# Patient Record
Sex: Male | Born: 1975 | Race: White | Hispanic: No | Smoking: Current every day smoker
Health system: Southern US, Community
[De-identification: ages and names within clinical notes are randomized; demographics above are authoritative.]

## PROBLEM LIST (undated history)

## (undated) DIAGNOSIS — J45909 Unspecified asthma, uncomplicated: Secondary | ICD-10-CM

## (undated) HISTORY — PX: ABDOMINAL SURGERY: SHX537

## (undated) HISTORY — PX: OTHER SURGICAL HISTORY: SHX169

## (undated) HISTORY — PX: HERNIA REPAIR: SHX51

## (undated) HISTORY — PX: MANDIBLE FRACTURE SURGERY: SHX706

---

## 2017-08-08 ENCOUNTER — Emergency Department
Admission: EM | Admit: 2017-08-08 | Discharge: 2017-08-08 | Disposition: A | Discharge status: Home / Self Care | Payer: Self-pay | Attending: Emergency Medicine | Admitting: Emergency Medicine

## 2017-08-08 ENCOUNTER — Other Ambulatory Visit: Payer: Self-pay

## 2017-08-08 ENCOUNTER — Encounter: Payer: Self-pay | Admitting: Emergency Medicine

## 2017-08-08 DIAGNOSIS — K047 Periapical abscess without sinus: Secondary | ICD-10-CM

## 2017-08-08 DIAGNOSIS — K130 Diseases of lips: Secondary | ICD-10-CM | POA: Insufficient documentation

## 2017-08-08 DIAGNOSIS — F17210 Nicotine dependence, cigarettes, uncomplicated: Secondary | ICD-10-CM | POA: Insufficient documentation

## 2017-08-08 MED ORDER — PERIGUARD EX OINT: 1.0000 "application " | TOPICAL_OINTMENT | Freq: Two times a day (BID) | CUTANEOUS | 0 refills | Status: AC

## 2017-08-08 MED ORDER — AMOXICILLIN 500 MG PO TABS: 500.0000 mg | ORAL_TABLET | Freq: Three times a day (TID) | ORAL | 0 refills | Status: DC

## 2017-08-08 NOTE — Discharge Instructions (Signed)
Please call and schedule a dental appointment as soon as possible. You will need to be seen within the next 14 days. Return to the emergency department for symptoms that change or worsen if you're unable to schedule an appointment.  OPTIONS FOR DENTAL FOLLOW UP CARE  [place] Department of Health and Human Services - Local Safety Net Dental Clinics http://www.ncdhhs.gov/dph/oralhealth/services/safetynetclinics.htm   [facility] ([phone])  [facility] ([phone])  [facility] ([phone])  [facility] ([phone])  [facility] ([phone]) This clinic caters to the indigent population and is on a lottery system. Location: [facility], [address], [place] Clinic Hours: Wednesdays from 6pm - 9pm, patients seen by a lottery system. For dates, call or go to www.med.[facility].edu/[facility]/patients/Dental-[facility] Services: Cleanings, fillings and simple extractions. Payment Options: DENTAL WORK IS FREE OF CHARGE. Bring proof of income or support. Best way to get seen: Arrive at 5:15 pm - this is a lottery, NOT first come/first serve, so arriving earlier will not increase your chances of being seen.     [facility] [phone] Select option 1 for emergencies   Location: [facility], [address], [place] Clinic Hours: No walk-ins accepted - call the day before to schedule an appointment. Check in times are 9:30 am and 1:30 pm. Services: Simple extractions, temporary fillings, pulpectomy/pulp debridement, uncomplicated abscess drainage. Payment Options: PAYMENT IS DUE AT THE TIME OF SERVICE.  Fee is usually $100-200, additional surgical procedures (e.g. abscess drainage) may be extra. Cash, checks, Visa/MasterCard accepted.  Can file Medicaid if patient is covered for dental - patient should call case worker to check. No  discount for [facility] patients. Best way to get seen: MUST call the day before and get onto the schedule. Can usually be seen the next 1-2 days. No walk-ins accepted.     [facility] [phone]   Location: [facility], [address], [place] [facility] Hours: M, W, Th, F 8am or 1:30pm, Tues 9a or 1:30 - first come/first served. Services: Simple extractions, temporary fillings, uncomplicated abscess drainage.  You do not need to be an [place] resident. Payment Options: PAYMENT IS DUE AT THE TIME OF SERVICE. Dental insurance, otherwise sliding scale - bring proof of income or support. Depending on income and treatment needed, cost is usually $50-200. Best way to get seen: Arrive early as it is first come/first served.     [facility] [phone]   Location: [address] Clinic Hours: Mon-Thu 8a-5p Services: Most basic dental services including extractions and fillings. Payment Options: PAYMENT IS DUE AT THE TIME OF SERVICE. Sliding scale, up to 50% off - bring proof if income or support. Medicaid with dental option accepted. Best way to get seen: Call to schedule an appointment, can usually be seen within 2 weeks OR they will try to see walk-ins - show up at 8a or 2p (you may have to wait).     [facility] [phone] [place] RESIDENTS ONLY   Location: [facility], [address], [place], [place] [phone] Clinic Hours: By appointment only. Monday - Thursday 8am-5pm, Friday 8am-12pm Services: Cleanings, fillings, extractions. Payment Options: PAYMENT IS DUE AT THE TIME OF SERVICE. Cash, Visa or MasterCard. Sliding scale - $30 minimum per service. Best way to get seen: Come in to office, complete packet and make an appointment -   need proof of income or support monies for each household member and proof of [place]  residence. Usually takes about a month to get in.     [facility] [phone]   Location: [address] [place] Clinic Hours: Walk-in Urgent Care Dental Services are offered Monday-Friday mornings only. The numbers of emergencies accepted daily is limited to the number of providers available. Maximum 15 - Mondays, Wednesdays & Thursdays Maximum 10 - Tuesdays & Fridays Services: You do not need to be a [place] resident to be seen for a dental emergency. Emergencies are defined as pain, swelling, abnormal bleeding, or dental trauma. Walkins will receive x-rays if needed. NOTE: Dental cleaning is not an emergency. Payment Options: PAYMENT IS DUE AT THE TIME OF SERVICE. Minimum co-pay is $40.00 for uninsured patients. Minimum co-pay is $3.00 for Medicaid with dental coverage. Dental Insurance is accepted and must be presented at time of visit. Medicare does not cover dental. Forms of payment: Cash, credit card, checks. Best way to get seen: If not previously registered with the clinic, walk-in dental registration begins at 7:15 am and is on a first come/first serve basis. If previously registered with the clinic, call to make an appointment.     [facility] [phone] [place] RESIDENTS ONLY   Location: [address], [place], [place] Clinic Hours: Mon-Thu 10a-2p Services: Extractions only! Payment Options: FREE (donations accepted) - bring proof of income or support Best way to get seen: Call and schedule an appointment OR come at 8am on the 1st Monday of every month (except for holidays) when it is first come/first served.     [facility] [phone]   Location: [address], [place] Clinic Hours: Friday mornings Services, Payment Options, Best way to get seen: Call for info  

## 2017-08-08 NOTE — ED Triage Notes (Signed)
Patient ambulatory to triage with steady gait, without difficulty or distress noted;   upper dental pain with swelling to right jaw; small sore to corner of right mouth

## 2017-08-08 NOTE — ED Notes (Signed)
Pt reports right sided facial swelling x4days. Pt c/o tongue swelling as well as mouth pain from dental complications. Pt states he has had multiple surgeries and plates place in his face.

## 2017-08-08 NOTE — ED Provider Notes (Signed)
[facility] Emergency Department Provider Note ____________________________________________  Time seen: Approximately 8:47 PM  I have reviewed the triage vital signs and the nursing notes.   HISTORY  Chief Complaint No chief complaint on file.   HPI Malachi Kinzler is a 42 y.o. male who presents to the emergency department for treatment and evaluation of  upper right side dental pain and swelling with a chronic ulceration to the corner of the right side of his mouth.  No known fever.   History reviewed. No pertinent past medical history.  There are no active problems to display for this patient.   Past Surgical History:  Procedure Laterality Date  . ABDOMINAL SURGERY    . HERNIA REPAIR    . MANDIBLE FRACTURE SURGERY      Prior to Admission medications   Medication Sig Start Date End Date Taking? Authorizing Provider  amoxicillin (AMOXIL) 500 MG tablet Take 1 tablet (500 mg total) by mouth 3 (three) times daily. 08/08/17   Mildreth Reek, Kasandra Knudsen, FNP  Skin Protectants, Misc. (PERIGUARD) OINT Apply 1 application topically 2 (two) times daily. 08/08/17   Chinita Pester, FNP    Allergies Patient has no known allergies.  No family history on file.  Social History Social History   Tobacco Use  . Smoking status: Current Every Day Smoker  . Smokeless tobacco: Never Used  Substance Use Topics  . Alcohol use: Not Currently  . Drug use: Not on file    Review of Systems Constitutional: Negative for fever. ENT: Positive for dental pain. Musculoskeletal: Positive for jaw pain. Skin: Positive for lesion in the corner of the mouth on the right side. ____________________________________________   PHYSICAL EXAM:  VITAL SIGNS: ED Triage Vitals  Enc Vitals Group     BP 08/08/17 1955 (!) 143/77     Pulse Rate 08/08/17 1955 (!) 103     Resp 08/08/17 1955 20     Temp 08/08/17 1955 98.4 F (36.9 C)     Temp Source 08/08/17 1955 Oral     SpO2 08/08/17 1955 97 %       Weight 08/08/17 1954 240 lb (108.9 kg)     Height 08/08/17 1954  (1.753 m)     Head Circumference --      Peak Flow --      Pain Score 08/08/17 1954 10     Pain Loc --      Pain Edu? --      Excl. in GC? --     Constitutional: Alert and oriented. Well appearing and in no acute distress. Eyes: Conjunctiva are clear without discharge or drainage. Mouth/Throat: Chronic appearing cheilitis to the corner of the mouth on the right side.  Periodontal Exam    Hematological/Lymphatic/Immunilogical: Tender anterior cervical adenopathy bilateral Respiratory: Respirations even and unlabored. Musculoskeletal: No trismus of the jaw Neurologic: Awake, alert, oriented x4 Skin: Mild erythema over the right side upper and lower jaw without erythema Psychiatric: Affect and behavior are appropriate.  ____________________________________________   LABS (all labs ordered are listed, but only abnormal results are displayed)  Labs Reviewed - No data to display ____________________________________________   RADIOLOGY  Not indicated ____________________________________________   PROCEDURES  Procedure(s) performed:   Procedures  Critical Care performed: No ____________________________________________   INITIAL IMPRESSION / ASSESSMENT AND PLAN / ED COURSE  [place] Urieta is a 42 y.o. male who presents to the emergency department for treatment and evaluation of dental pain and a lesion to the corner of  his lip on the right side.  Patient states that he had to have a plate inserted when he fractured his mandible and as a result has had a loss of some sensation on that right side.  He states that he drools significantly in his sleep and has had problems with a chronic crack in the skin in the corner of the mouth.  For the dental abscess he will be treated with amoxicillin and encouraged to continue the ibuprofen.  He was given ointment to apply as a barrier to the corner of his mouth in the  morning and at night.  He was strongly encouraged to schedule appointment with the dentist within 2 weeks.  He will be given a list of dental clinics in the area.  He is advised to return to the emergency department for symptoms that change or worsen if he is unable to see the dentist.  Pertinent labs & imaging results that were available during my care of the patient were reviewed by me and considered in my medical decision making (see chart for details).  ____________________________________________   FINAL CLINICAL IMPRESSION(S) / ED DIAGNOSES  Final diagnoses:  Dental abscess  Angular cheilitis    New Prescriptions   AMOXICILLIN (AMOXIL) 500 MG TABLET    Take 1 tablet (500 mg total) by mouth 3 (three) times daily.   SKIN PROTECTANTS, MISC. (PERIGUARD) OINT    Apply 1 application topically 2 (two) times daily.    If controlled substance prescribed during this visit, 12 month history viewed on the NCCSRS prior to issuing an initial prescription for Schedule II or III opiod.  Note:  This document was prepared using Dragon voice recognition software and may include unintentional dictation errors.    Chinita Pester, FNP 08/08/17 2126    Dionne Bucy, MD 08/08/17 [phone]

## 2017-10-22 ENCOUNTER — Emergency Department: Payer: Self-pay

## 2017-10-22 ENCOUNTER — Other Ambulatory Visit: Payer: Self-pay

## 2017-10-22 ENCOUNTER — Emergency Department
Admission: EM | Admit: 2017-10-22 | Discharge: 2017-10-22 | Disposition: A | Discharge status: Home / Self Care | Payer: Self-pay | Attending: Emergency Medicine | Admitting: Emergency Medicine

## 2017-10-22 DIAGNOSIS — S40812A Abrasion of left upper arm, initial encounter: Secondary | ICD-10-CM | POA: Insufficient documentation

## 2017-10-22 DIAGNOSIS — Y929 Unspecified place or not applicable: Secondary | ICD-10-CM | POA: Insufficient documentation

## 2017-10-22 DIAGNOSIS — M25552 Pain in left hip: Secondary | ICD-10-CM | POA: Insufficient documentation

## 2017-10-22 DIAGNOSIS — T07XXXA Unspecified multiple injuries, initial encounter: Secondary | ICD-10-CM

## 2017-10-22 DIAGNOSIS — S0081XA Abrasion of other part of head, initial encounter: Secondary | ICD-10-CM | POA: Insufficient documentation

## 2017-10-22 DIAGNOSIS — Y939 Activity, unspecified: Secondary | ICD-10-CM | POA: Insufficient documentation

## 2017-10-22 DIAGNOSIS — F17200 Nicotine dependence, unspecified, uncomplicated: Secondary | ICD-10-CM | POA: Insufficient documentation

## 2017-10-22 DIAGNOSIS — S40811A Abrasion of right upper arm, initial encounter: Secondary | ICD-10-CM | POA: Insufficient documentation

## 2017-10-22 DIAGNOSIS — Y999 Unspecified external cause status: Secondary | ICD-10-CM | POA: Insufficient documentation

## 2017-10-22 MED ORDER — KETOROLAC TROMETHAMINE 60 MG/2ML IM SOLN
60.0000 mg | Freq: Once | INTRAMUSCULAR | Status: AC
  Administered 2017-10-22: 60 mg via INTRAMUSCULAR
  Filled 2017-10-22: qty 2

## 2017-10-22 MED ORDER — TRAMADOL HCL 50 MG PO TABS: 50.0000 mg | ORAL_TABLET | Freq: Four times a day (QID) | ORAL | 0 refills | Status: DC | PRN
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
## 2017-10-22 NOTE — Discharge Instructions (Addendum)
Please follow-up with the acute care clinic for further evaluation.  Please ensure that you keep your abrasions clean and ice your hip which still hurts.  Please return with any worsening symptoms or any other concerns.
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
## 2017-10-30 ENCOUNTER — Emergency Department
Admission: EM | Admit: 2017-10-30 | Discharge: 2017-10-30 | Disposition: A | Discharge status: Home / Self Care | Payer: Self-pay | Attending: Emergency Medicine | Admitting: Emergency Medicine

## 2017-10-30 ENCOUNTER — Other Ambulatory Visit: Payer: Self-pay

## 2017-10-30 DIAGNOSIS — F17210 Nicotine dependence, cigarettes, uncomplicated: Secondary | ICD-10-CM | POA: Insufficient documentation

## 2017-10-30 DIAGNOSIS — Z79899 Other long term (current) drug therapy: Secondary | ICD-10-CM | POA: Insufficient documentation

## 2017-10-30 DIAGNOSIS — M79671 Pain in right foot: Secondary | ICD-10-CM | POA: Insufficient documentation

## 2017-10-30 DIAGNOSIS — L03115 Cellulitis of right lower limb: Secondary | ICD-10-CM | POA: Insufficient documentation

## 2017-10-30 LAB — CBC WITH DIFFERENTIAL/PLATELET
BASOS ABS: 0 10*3/uL (ref 0–0.1)
Basophils Relative: 0 %
EOS ABS: 0.1 10*3/uL (ref 0–0.7)
Eosinophils Relative: 1 %
HEMATOCRIT: 42.6 % (ref 40.0–52.0)
Hemoglobin: 15 g/dL (ref 13.0–18.0)
Lymphocytes Relative: 10 %
Lymphs Abs: 1.5 10*3/uL (ref 1.0–3.6)
MCH: 32.1 pg (ref 26.0–34.0)
MCHC: 35.2 g/dL (ref 32.0–36.0)
MCV: 91.2 fL (ref 80.0–100.0)
MONO ABS: 1 10*3/uL (ref 0.2–1.0)
Monocytes Relative: 7 %
NEUTROS ABS: 12.2 10*3/uL — AB (ref 1.4–6.5)
NEUTROS PCT: 82 %
PLATELETS: 231 10*3/uL (ref 150–440)
RBC: 4.68 MIL/uL (ref 4.40–5.90)
RDW: 12.8 % (ref 11.5–14.5)
WBC: 14.9 10*3/uL — ABNORMAL HIGH (ref 3.8–10.6)

## 2017-10-30 LAB — URINALYSIS, COMPLETE (UACMP) WITH MICROSCOPIC
Bacteria, UA: NONE SEEN
Bilirubin Urine: NEGATIVE
Glucose, UA: NEGATIVE mg/dL
HGB URINE DIPSTICK: NEGATIVE
Ketones, ur: NEGATIVE mg/dL
Leukocytes, UA: NEGATIVE
NITRITE: NEGATIVE
PROTEIN: NEGATIVE mg/dL
SPECIFIC GRAVITY, URINE: 1.012 (ref 1.005–1.030)
pH: 7 (ref 5.0–8.0)

## 2017-10-30 LAB — COMPREHENSIVE METABOLIC PANEL
ALT: 27 U/L (ref 0–44)
AST: 28 U/L (ref 15–41)
Albumin: 3.9 g/dL (ref 3.5–5.0)
Alkaline Phosphatase: 137 U/L — ABNORMAL HIGH (ref 38–126)
Anion gap: 7 (ref 5–15)
BUN: 8 mg/dL (ref 6–20)
CALCIUM: 8.7 mg/dL — AB (ref 8.9–10.3)
CO2: 25 mmol/L (ref 22–32)
Chloride: 103 mmol/L (ref 98–111)
Creatinine, Ser: 0.99 mg/dL (ref 0.61–1.24)
GFR calc Af Amer: >60 mL/min (ref >60)
GFR calc non Af Amer: >60 mL/min (ref >60)
Glucose, Bld: 173 mg/dL — ABNORMAL HIGH (ref 70–99)
Potassium: 4.2 mmol/L (ref 3.5–5.1)
Sodium: 135 mmol/L (ref 135–145)
Total Bilirubin: 0.7 mg/dL (ref 0.3–1.2)
Total Protein: 7.2 g/dL (ref 6.5–8.1)

## 2017-10-30 LAB — PROTIME-INR
INR: 1.03
PROTHROMBIN TIME: 13.4 s (ref 11.4–15.2)

## 2017-10-30 LAB — LACTIC ACID, PLASMA: LACTIC ACID, VENOUS: 2.1 mmol/L — AB (ref 0.5–1.9)

## 2017-10-30 MED ORDER — ACETAMINOPHEN 325 MG PO TABS
650.0000 mg | ORAL_TABLET | Freq: Once | ORAL | Status: AC | PRN
  Administered 2017-10-30: 650 mg via ORAL
  Filled 2017-10-30: qty 2

## 2017-10-30 MED ORDER — CEPHALEXIN 500 MG PO CAPS: 500.0000 mg | ORAL_CAPSULE | Freq: Three times a day (TID) | ORAL | 0 refills | Status: DC

## 2017-10-30 MED ORDER — TRAMADOL HCL 50 MG PO TABS: 50.0000 mg | ORAL_TABLET | Freq: Four times a day (QID) | ORAL | 0 refills | Status: AC | PRN

## 2017-10-30 NOTE — ED Provider Notes (Signed)
[facility] Emergency Department Provider Note  ____________________________________________  Time seen: Approximately 8:02 PM  I have reviewed the triage vital signs and the nursing notes.   HISTORY  Chief Complaint Foot Pain    HPI Brent Case is a 42 y.o. 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  He was seen at an outside hospital in [place], diagnosed with a fracture, put on a walking shoe, wound was repaired and he was put on Bactrim.  He has been compliant with the medicine.  Reports that he ran out of his tramadol and hydrocodone and is still having pain .  Also feels that the dressing is too tight and would like it changed.  Symptoms are constant, no aggravating or alleviating factors.  Moderate intensity.  Denies fever chills nausea or vomiting.    Past medical history noncontributory   There are no active problems to display for this patient.    Past Surgical History:  Procedure Laterality Date  . ABDOMINAL SURGERY    . HERNIA REPAIR    . MANDIBLE FRACTURE SURGERY       Prior to Admission medications   Medication Sig Start Date End Date Taking? Authorizing Provider  HYDROcodone-acetaminophen (NORCO/VICODIN) 5-325 MG tablet Take 1 tablet by mouth every 6 (six) hours as needed. 10/29/17  Yes [provider]  Skin Protectants, Misc. (PERIGUARD) OINT Apply 1 application topically 2 (two) times daily. 08/08/17  Yes Triplett, Cari B, FNP  sulfamethoxazole-trimethoprim (BACTRIM DS,SEPTRA DS) 800-160 MG tablet Take 1 tablet by mouth 2 (two) times daily. 10/29/17  Yes [provider]  amoxicillin (AMOXIL) 500 MG tablet Take 1 tablet (500 mg total) by mouth 3 (three) times daily. Patient not taking: Reported on 10/30/2017 08/08/17   Kem Boroughsriplett, Cari B, FNP  cephALEXin (KEFLEX) 500 MG  capsule Take 1 capsule (500 mg total) by mouth 3 (three) times daily. 10/30/17   Sharman CheekStafford, Cheron Pasquarelli, MD  traMADol (ULTRAM) 50 MG tablet Take 1 tablet (50 mg total) by mouth every 6 (six) hours as needed. 10/30/17   Sharman CheekStafford, Trevione Wert, MD     Allergies Patient has no known allergies.   No family history on file.  Social History Social History   Tobacco Use  . Smoking status: Current Every Day Smoker  . Smokeless tobacco: Never Used  Substance Use Topics  . Alcohol use: Not Currently  . Drug use: Not on file    Review of Systems  Constitutional:   No fever or chills.  ENT:   No sore throat. No rhinorrhea. Cardiovascular:   No chest pain or syncope. Respiratory:   No dyspnea or cough. Gastrointestinal:   Negative for abdominal pain, vomiting and diarrhea.  Musculoskeletal:   Right foot pain and swelling All other systems reviewed and are negative except as documented above in ROS and HPI.  ____________________________________________   PHYSICAL EXAM:  VITAL SIGNS: ED Triage Vitals  Enc Vitals Group     BP 10/30/17 1735 (!) 150/74     Pulse Rate 10/30/17 1735 (!) 114     Resp 10/30/17 1735 18     Temp 10/30/17 1735 (!) 101.7 F (38.7 C)     Temp Source 10/30/17 1735 Oral     SpO2 10/30/17 1735 98 %     Weight 10/30/17 1737 240 lb (108.9 kg)  Height 10/30/17 1737 5\' 9"  (1.753 m)     Head Circumference --      Peak Flow --      Pain Score 10/30/17 1736 9     Pain Loc --      Pain Edu? --      Excl. in GC? --     Vital signs reviewed, nursing assessments reviewed.   Constitutional:   Alert and oriented. Non-toxic appearance. Eyes:   Conjunctivae are normal. EOMI. PERRL. ENT      Head:   Normocephalic and atraumatic.      Nose:   No congestion/rhinnorhea.       Mouth/Throat:   MMM, no pharyngeal erythema. No peritonsillar mass.       Neck:   No meningismus. Full ROM. Hematological/Lymphatic/Immunilogical:   No cervical lymphadenopathy. Cardiovascular:   RRR.  Symmetric bilateral radial and DP pulses.  No murmurs. Cap refill less than 2 seconds. Respiratory:   Normal respiratory effort without tachypnea/retractions. Breath sounds are clear and equal bilaterally. No wheezes/rales/rhonchi. Gastrointestinal:   Soft and nontender. Non distended. There is no CVA tenderness.  No rebound, rigidity, or guarding. Musculoskeletal:   Pain swelling of the right foot.  Tenderness to the touch over the first metatarsal and great toe.  Sutures are intact without purulent drainage.  No fluctuance.  No crepitus.  No visible deformity.  No streaking. Neurologic:   Normal speech and language.  Motor grossly intact. No acute focal neurologic deficits are appreciated.  Skin:    Skin is warm, dry and intact. No rash noted.  No petechiae, purpura, or bullae.  ____________________________________________    LABS (pertinent positives/negatives) (all labs ordered are listed, but only abnormal results are displayed) Labs Reviewed  COMPREHENSIVE METABOLIC PANEL - Abnormal; Notable for the following components:      Result Value   Glucose, Bld 173 (*)    Calcium 8.7 (*)    Alkaline Phosphatase 137 (*)    All other components within normal limits  LACTIC ACID, PLASMA - Abnormal; Notable for the following components:   Lactic Acid, Venous 2.1 (*)    All other components within normal limits  CBC WITH DIFFERENTIAL/PLATELET - Abnormal; Notable for the following components:   WBC 14.9 (*)    Neutro Abs 12.2 (*)    All other components within normal limits  URINALYSIS, COMPLETE (UACMP) WITH MICROSCOPIC - Abnormal; Notable for the following components:   Color, Urine YELLOW (*)    APPearance CLEAR (*)    All other components within normal limits  CULTURE, BLOOD (ROUTINE X 2)  CULTURE, BLOOD (ROUTINE X 2)  PROTIME-INR   ____________________________________________   EKG    ____________________________________________    RADIOLOGY  No results  found.  ____________________________________________   PROCEDURES Procedures  ____________________________________________    CLINICAL IMPRESSION / ASSESSMENT AND PLAN / ED COURSE  Pertinent labs & imaging results that were available during my care of the patient were reviewed by me and considered in my medical decision making (see chart for details).    Patient presents with fever tachycardia and leukocytosis in the setting of a recent traumatic injury to the right foot resulting in fractures and laceration that have been repaired at an outside hospital in [place].  He is taking Bactrim, but is having redness and swelling to the foot.  Concern for worsening infection in the foot, but patient on my evaluation reports that he only wants to have the dressing changed and then have his IV removed and  be discharged.  Not my preferred course of action, but he has medical decision-making capacity, otherwise relatively young and healthy, tetanus up-to-date, and taking antibiotics.  I will add Keflex, strict return precautions.  No crepitus on exam, no purulent drainage or fluctuance.  I do not believe the patient is septic currently.      ____________________________________________   FINAL CLINICAL IMPRESSION(S) / ED DIAGNOSES    Final diagnoses:  Cellulitis of right foot  Foot pain, right     ED Discharge Orders        Ordered    cephALEXin (KEFLEX) 500 MG capsule  3 times daily     10/30/17 2000    traMADol (ULTRAM) 50 MG tablet  Every 6 hours PRN     10/30/17 2002      Portions of this note were generated with dragon dictation software. Dictation errors may occur despite best attempts at proofreading.    Sharman Cheek, MD 10/30/17 2008

## 2017-10-30 NOTE — ED Notes (Signed)
Pt has taken out IV, catheter intact on assessment by this RN. Pt asking for discharge paperwork. MD made aware.

## 2017-10-30 NOTE — ED Notes (Signed)
MD at bedside. 

## 2017-10-30 NOTE — ED Triage Notes (Signed)
Pt arrives to ED with R foot pain. States was seen Sunday after tree fell on foot. States stitches in toes. C/o of increased swelling in R foot. Informed after trauma swelling will occur. Pt hasn't changed bandage since Sunday. States "I don't want to do it because I don't want to screw it up." currently on antibiotics. Hasn't been taking any anti-inflammatories. Has been using ice.

## 2017-11-04 LAB — CULTURE, BLOOD (ROUTINE X 2)
Culture: NO GROWTH
Culture: NO GROWTH
Special Requests: ADEQUATE

## 2017-11-15 ENCOUNTER — Other Ambulatory Visit: Payer: Self-pay

## 2017-11-15 ENCOUNTER — Encounter: Payer: Self-pay | Admitting: Emergency Medicine

## 2017-11-15 ENCOUNTER — Emergency Department
Admission: EM | Admit: 2017-11-15 | Discharge: 2017-11-15 | Disposition: A | Discharge status: Home / Self Care | Payer: Self-pay | Attending: Emergency Medicine | Admitting: Emergency Medicine

## 2017-11-15 ENCOUNTER — Emergency Department: Payer: Self-pay

## 2017-11-15 DIAGNOSIS — M869 Osteomyelitis, unspecified: Secondary | ICD-10-CM | POA: Insufficient documentation

## 2017-11-15 DIAGNOSIS — F17200 Nicotine dependence, unspecified, uncomplicated: Secondary | ICD-10-CM | POA: Insufficient documentation

## 2017-11-15 DIAGNOSIS — R2241 Localized swelling, mass and lump, right lower limb: Secondary | ICD-10-CM | POA: Insufficient documentation

## 2017-11-15 LAB — COMPREHENSIVE METABOLIC PANEL
ALT: 26 U/L (ref 0–44)
ANION GAP: 8 (ref 5–15)
AST: 29 U/L (ref 15–41)
Albumin: 4 g/dL (ref 3.5–5.0)
Alkaline Phosphatase: 149 U/L — ABNORMAL HIGH (ref 38–126)
BUN: 16 mg/dL (ref 6–20)
CO2: 25 mmol/L (ref 22–32)
Calcium: 9.2 mg/dL (ref 8.9–10.3)
Chloride: 106 mmol/L (ref 98–111)
Creatinine, Ser: 1.06 mg/dL (ref 0.61–1.24)
GFR calc non Af Amer: >60 mL/min (ref >60)
Glucose, Bld: 139 mg/dL — ABNORMAL HIGH (ref 70–99)
POTASSIUM: 4 mmol/L (ref 3.5–5.1)
SODIUM: 139 mmol/L (ref 135–145)
Total Bilirubin: 0.8 mg/dL (ref 0.3–1.2)
Total Protein: 7.8 g/dL (ref 6.5–8.1)

## 2017-11-15 LAB — CBC WITH DIFFERENTIAL/PLATELET
BASOS PCT: 0 %
Basophils Absolute: 0 10*3/uL (ref 0–0.1)
Eosinophils Absolute: 0.2 10*3/uL (ref 0–0.7)
Eosinophils Relative: 2 %
HEMATOCRIT: 43.4 % (ref 40.0–52.0)
HEMOGLOBIN: 14.9 g/dL (ref 13.0–18.0)
LYMPHS ABS: 2.1 10*3/uL (ref 1.0–3.6)
Lymphocytes Relative: 16 %
MCH: 31.4 pg (ref 26.0–34.0)
MCHC: 34.4 g/dL (ref 32.0–36.0)
MCV: 91.2 fL (ref 80.0–100.0)
MONOS PCT: 8 %
Monocytes Absolute: 1 10*3/uL (ref 0.2–1.0)
NEUTROS ABS: 10.3 10*3/uL — AB (ref 1.4–6.5)
NEUTROS PCT: 74 %
Platelets: 332 10*3/uL (ref 150–440)
RBC: 4.76 MIL/uL (ref 4.40–5.90)
RDW: 13.1 % (ref 11.5–14.5)
WBC: 13.7 10*3/uL — AB (ref 3.8–10.6)

## 2017-11-15 LAB — LACTIC ACID, PLASMA
LACTIC ACID, VENOUS: 2.1 mmol/L — AB (ref 0.5–1.9)
LACTIC ACID, VENOUS: 1.1 mmol/L (ref 0.5–1.9)

## 2017-11-15 MED ORDER — CLINDAMYCIN HCL 300 MG PO CAPS: 300.0000 mg | ORAL_CAPSULE | Freq: Four times a day (QID) | ORAL | 0 refills | Status: AC

## 2017-11-15 MED ORDER — SODIUM CHLORIDE 0.9 % IV SOLN
2.0000 g | Freq: Once | INTRAVENOUS | Status: AC
  Administered 2017-11-15: 2 g via INTRAVENOUS
  Filled 2017-11-15: qty 2

## 2017-11-15 MED ORDER — VANCOMYCIN HCL IN DEXTROSE 1-5 GM/200ML-% IV SOLN
1000.0000 mg | Freq: Once | INTRAVENOUS | Status: AC
  Administered 2017-11-15: 1000 mg via INTRAVENOUS
  Filled 2017-11-15: qty 200

## 2017-11-15 NOTE — ED Notes (Signed)
Erythema to right great toe with swelling. Toe color is black and yellow.

## 2017-11-15 NOTE — ED Triage Notes (Signed)
Pt presents to ED via POV c/o wound to R foot. Pt seen 7/31 for cellulitis and given keflex which pt states he completed. Pt states wound has not improved. Site noted to have black and yellow wound bed with purulent exudate.

## 2017-11-15 NOTE — Discharge Instructions (Signed)
As we discussed you could not stay for admission to the hospital today.  Please take your antibiotics and return as soon as possible for reevaluation and consideration for admission to the hospital for IV antibiotics.  If you are unable to return for admission please continue to take your antibiotics as prescribed and follow-up with podiatry.

## 2017-11-15 NOTE — ED Notes (Signed)
One set of blood cultures drawn along with IV stick and sent to lab.

## 2017-11-15 NOTE — ED Provider Notes (Signed)
[facility] Emergency Department Provider Note  Time seen: 6:06 PM  I have reviewed the triage vital signs and the nursing notes.   HISTORY  Chief Complaint Wound Infection    HPI Brent Case is a 42 y.o. male with no significant past medical history presents to the emergency department for a right great toe infection.  According to the patient approximately 5 to 6 weeks ago a tree fell on his foot causing a laceration and fracture to his foot.  This was repaired with sutures however it appeared to had become infected and patient was started on antibiotics 10/30/2017.  Patient states he completed his course of antibiotics but the swelling discharge and smell continued to worsen so he came back to the emergency department today for evaluation.  Denies any significant pain to the foot.   History reviewed. No pertinent past medical history.  There are no active problems to display for this patient.   Past Surgical History:  Procedure Laterality Date  . ABDOMINAL SURGERY    . HERNIA REPAIR    . MANDIBLE FRACTURE SURGERY      Prior to Admission medications   Medication Sig Start Date End Date Taking? Authorizing Provider  amoxicillin (AMOXIL) 500 MG tablet Take 1 tablet (500 mg total) by mouth 3 (three) times daily. Patient not taking: Reported on 10/30/2017 08/08/17   Kem Boroughsriplett, Cari B, FNP  cephALEXin (KEFLEX) 500 MG capsule Take 1 capsule (500 mg total) by mouth 3 (three) times daily. Patient not taking: Reported on 11/15/2017 10/30/17   Sharman CheekStafford, Phillip, MD  Skin Protectants, Misc. (PERIGUARD) OINT Apply 1 application topically 2 (two) times daily. 08/08/17   Triplett, Cari B, FNP  traMADol (ULTRAM) 50 MG tablet Take 1 tablet (50 mg total) by mouth every 6 (six) hours as needed. 10/30/17   Sharman CheekStafford, Phillip, MD    No Known Allergies  History reviewed. No pertinent family history.  Social History Social History   Tobacco Use  . Smoking status: Current Every  Day Smoker  . Smokeless tobacco: Never Used  Substance Use Topics  . Alcohol use: Not Currently  . Drug use: Not on file    Review of Systems Constitutional: Negative for fever. Cardiovascular: Negative for chest pain. Respiratory: Negative for shortness of breath. Gastrointestinal: Negative for abdominal pain.  Negative for vomiting. Genitourinary: Negative for urinary compaints Musculoskeletal: Right great toe swelling and discharge Skin: Erythema with ulceration of skin tissue to right great toe Neurological: Negative for headache All other ROS negative  ____________________________________________   PHYSICAL EXAM:  VITAL SIGNS: ED Triage Vitals  Enc Vitals Group     BP 11/15/17 1636 (!) 142/82     Pulse Rate 11/15/17 1636 94     Resp 11/15/17 1636 18     Temp 11/15/17 1636 98.7 F (37.1 C)     Temp Source 11/15/17 1636 Oral     SpO2 --      Weight 11/15/17 1637 236 lb (107 kg)     Height 11/15/17 1637 5\' 9"  (1.753 m)     Head Circumference --      Peak Flow --      Pain Score 11/15/17 1637 0     Pain Loc --      Pain Edu? --      Excl. in GC? --     Constitutional: Alert and oriented. Well appearing and in no distress. Eyes: Normal exam ENT   Head: Normocephalic and atraumatic.   Mouth/Throat: Mucous  membranes are moist. Cardiovascular: Normal rate, regular rhythm.  Respiratory: Normal respiratory effort without tachypnea nor retractions. Breath sounds are clear  Gastrointestinal: Soft and nontender. No distention.   Musculoskeletal: Swelling to right great toe with ulceration of skin.  There is a small area that was a prior laceration that still appears to have some suture material.  Foul-smelling most consistent with infection Neurologic:  Normal speech and language. No gross focal neurologic deficits are appreciated. Skin:  Skin is warm.  With skin breakdown and erythema as described above the right great toe Psychiatric: Mood and affect are normal.  Speech and behavior are normal.     RADIOLOGY  X-ray shows bony destruction consistent with osteomyelitis of the first toe.  Also a fracture of the second distal phalanx.  ____________________________________________   INITIAL IMPRESSION / ASSESSMENT AND PLAN / ED COURSE  Pertinent labs & imaging results that were available during my care of the patient were reviewed by me and considered in my medical decision making (see chart for details).  Emergency department for swelling and discharge of the right great toe.  Differential would include cellulitis, abscess, osteomyelitis.  X-ray most consistent with osteomyelitis.  Patient's labs show moderate leukocytosis of 13,000 with a lactate elevation of 2.1.  Patient is afebrile.  I discussed these results with the patient and the need to admit to the hospital for IV antibiotics.  Patient is reluctant to be admitted, is agreeable to the IV antibiotics for now and will discuss with his wife the logistics of coming into the hospital.  We will check blood cultures start on vancomycin and cefepime.  Patient states he cannot stay for admission today because of his kids.  We will discharge patient home on clindamycin.  Patient states he will come back tomorrow for admission.  I discussed return precautions for any fever vomiting unable to keep down antibiotics.  Patient agreeable plan of care.  ____________________________________________   FINAL CLINICAL IMPRESSION(S) / ED DIAGNOSES  Osteomyelitis Cellulitis    Minna AntisPaduchowski, Fallynn Gravett, MD 11/15/17 [phone]

## 2017-11-20 LAB — CULTURE, BLOOD (ROUTINE X 2)
Culture: NO GROWTH
Special Requests: ADEQUATE

## 2017-11-21 LAB — CULTURE, BLOOD (ROUTINE X 2): Culture: NO GROWTH

## 2017-12-30 ENCOUNTER — Other Ambulatory Visit: Payer: Self-pay

## 2017-12-30 ENCOUNTER — Encounter: Payer: Self-pay | Admitting: Emergency Medicine

## 2017-12-30 ENCOUNTER — Emergency Department
Admission: EM | Admit: 2017-12-30 | Discharge: 2017-12-30 | Disposition: A | Discharge status: Home / Self Care | Payer: Self-pay | Attending: Student in an Organized Health Care Education/Training Program | Admitting: Student in an Organized Health Care Education/Training Program

## 2017-12-30 ENCOUNTER — Emergency Department: Payer: Self-pay

## 2017-12-30 DIAGNOSIS — F17210 Nicotine dependence, cigarettes, uncomplicated: Secondary | ICD-10-CM | POA: Insufficient documentation

## 2017-12-30 DIAGNOSIS — L03115 Cellulitis of right lower limb: Secondary | ICD-10-CM | POA: Insufficient documentation

## 2017-12-30 DIAGNOSIS — Z79899 Other long term (current) drug therapy: Secondary | ICD-10-CM | POA: Insufficient documentation

## 2017-12-30 DIAGNOSIS — Z89431 Acquired absence of right foot: Secondary | ICD-10-CM | POA: Insufficient documentation

## 2017-12-30 LAB — COMPREHENSIVE METABOLIC PANEL
ALBUMIN: 4.1 g/dL (ref 3.5–5.0)
ALT: 28 U/L (ref 0–44)
AST: 28 U/L (ref 15–41)
Alkaline Phosphatase: 121 U/L (ref 38–126)
Anion gap: 9 (ref 5–15)
BUN: 8 mg/dL (ref 6–20)
CHLORIDE: 105 mmol/L (ref 98–111)
CO2: 24 mmol/L (ref 22–32)
Calcium: 8.9 mg/dL (ref 8.9–10.3)
Creatinine, Ser: 1.18 mg/dL (ref 0.61–1.24)
GFR calc Af Amer: >60 mL/min (ref >60)
Glucose, Bld: 170 mg/dL — ABNORMAL HIGH (ref 70–99)
POTASSIUM: 3.8 mmol/L (ref 3.5–5.1)
Sodium: 138 mmol/L (ref 135–145)
Total Bilirubin: 1.3 mg/dL — ABNORMAL HIGH (ref 0.3–1.2)
Total Protein: 6.9 g/dL (ref 6.5–8.1)

## 2017-12-30 LAB — CBC WITH DIFFERENTIAL/PLATELET
BASOS ABS: 0 10*3/uL (ref 0–0.1)
Basophils Relative: 0 %
EOS PCT: 2 %
Eosinophils Absolute: 0.2 10*3/uL (ref 0–0.7)
HCT: 44.4 % (ref 40.0–52.0)
Hemoglobin: 15.7 g/dL (ref 13.0–18.0)
Lymphocytes Relative: 17 %
Lymphs Abs: 1.9 10*3/uL (ref 1.0–3.6)
MCH: 32.1 pg (ref 26.0–34.0)
MCHC: 35.4 g/dL (ref 32.0–36.0)
MCV: 90.7 fL (ref 80.0–100.0)
MONO ABS: 0.8 10*3/uL (ref 0.2–1.0)
Monocytes Relative: 7 %
Neutro Abs: 8.3 10*3/uL — ABNORMAL HIGH (ref 1.4–6.5)
Neutrophils Relative %: 74 %
PLATELETS: 194 10*3/uL (ref 150–440)
RBC: 4.9 MIL/uL (ref 4.40–5.90)
RDW: 13.6 % (ref 11.5–14.5)
WBC: 11.2 10*3/uL — ABNORMAL HIGH (ref 3.8–10.6)

## 2017-12-30 MED ORDER — AMOXICILLIN-POT CLAVULANATE 875-125 MG PO TABS
1.0000 | ORAL_TABLET | Freq: Once | ORAL | Status: AC
  Administered 2017-12-30: 1 via ORAL
  Filled 2017-12-30: qty 1

## 2017-12-30 MED ORDER — AMOXICILLIN-POT CLAVULANATE 875-125 MG PO TABS: 1.0000 | ORAL_TABLET | Freq: Two times a day (BID) | ORAL | 0 refills | Status: AC

## 2017-12-30 NOTE — ED Provider Notes (Signed)
[facility] Emergency Department Provider Note    First MD Initiated Contact with Patient 12/30/17 1806     (approximate)  I have reviewed the triage vital signs and the nursing notes.   HISTORY  Chief Complaint Post-op Problem    HPI Brent Case is a 42 y.o. 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  Subsequently the distal phalanx became infected and developed osteomyelitis.  Had surgery performed in [place].  Patient has outpatient wound care clinic tomorrow but states he has been having worsening swelling and redness at the base of the great toe over the past 24 hours.  Denies any fevers.  No nausea or vomiting.  Denies any history of diabetes.  Denies any interval injuries.    History reviewed. No pertinent past medical history. No family history on file. Past Surgical History:  Procedure Laterality Date  . ABDOMINAL SURGERY    . HERNIA REPAIR    . MANDIBLE FRACTURE SURGERY     There are no active problems to display for this patient.     Prior to Admission medications   Medication Sig Start Date End Date Taking? Authorizing Provider  amoxicillin (AMOXIL) 500 MG tablet Take 1 tablet (500 mg total) by mouth 3 (three) times daily. Patient not taking: Reported on 10/30/2017 08/08/17   Kem Boroughs B, FNP  amoxicillin-clavulanate (AUGMENTIN) 875-125 MG tablet Take 1 tablet by mouth 2 (two) times daily for 7 days. 12/30/17 01/06/18  Willy Eddy, MD  cephALEXin (KEFLEX) 500 MG capsule Take 1 capsule (500 mg total) by mouth 3 (three) times daily. Patient not taking: Reported on 11/15/2017 10/30/17   Sharman Cheek, MD  Skin Protectants, Misc. (PERIGUARD) OINT Apply 1 application topically 2 (two) times daily. 08/08/17   Triplett, Cari B, FNP  traMADol (ULTRAM) 50 MG tablet Take 1 tablet (50 mg total) by mouth every 6 (six) hours as needed.  10/30/17   Sharman Cheek, MD    Allergies Patient has no known allergies.    Social History Social History   Tobacco Use  . Smoking status: Current Every Day Smoker    Packs/day: 2.00    Types: Cigarettes  . Smokeless tobacco: Never Used  Substance Use Topics  . Alcohol use: Not Currently  . Drug use: Not on file    Review of Systems Patient denies headaches, rhinorrhea, blurry vision, numbness, shortness of breath, chest pain, edema, cough, abdominal pain, nausea, vomiting, diarrhea, dysuria, fevers, rashes or hallucinations unless otherwise stated above in HPI. ____________________________________________   PHYSICAL EXAM:  VITAL SIGNS: Vitals:   12/30/17 1531 12/30/17 1811  BP: 136/87 (!) 143/85  Pulse: 87 80  Resp: 18 20  Temp: 98.1 F (36.7 C) 97.9 F (36.6 C)  SpO2: 97% 98%    Constitutional: Alert and oriented.  Eyes: Conjunctivae are normal.  Head: Atraumatic. Nose: No congestion/rhinnorhea. Mouth/Throat: Mucous membranes are moist.   Neck: No stridor. Painless ROM.  Cardiovascular: Normal rate, regular rhythm. Grossly normal heart sounds.  Good peripheral circulation. Respiratory: Normal respiratory effort.  No retractions. Lungs CTAB. Gastrointestinal: Soft and nontender. No distention. No abdominal bruits. No CVA tenderness. Genitourinary:  Musculoskeletal: Right great toe amputation with granulation tissue and chronic wound with surrounding erythema but no crepitus or fluctuant mass.  He has strong DP and PT pulses with brisk cap refill..  No joint effusions. Neurologic:  Normal speech and language. No  gross focal neurologic deficits are appreciated. No facial droop Skin:  Skin is warm, dry and intact. No rash noted. Psychiatric: Mood and affect are normal. Speech and behavior are normal.  ____________________________________________   LABS (all labs ordered are listed, but only abnormal results are displayed)  Results for orders placed or  performed during the hospital encounter of 12/30/17 (from the past 24 hour(s))  CBC with Differential     Status: Abnormal   Collection Time: 12/30/17  3:35 PM  Result Value Ref Range   WBC 11.2 (H) 3.8 - 10.6 K/uL   RBC 4.90 4.40 - 5.90 MIL/uL   Hemoglobin 15.7 13.0 - 18.0 g/dL   HCT 16.1 09.6 - 04.5 %   MCV 90.7 80.0 - 100.0 fL   MCH 32.1 26.0 - 34.0 pg   MCHC 35.4 32.0 - 36.0 g/dL   RDW 40.9 81.1 - 91.4 %   Platelets 194 150 - 440 K/uL   Neutrophils Relative % 74 %   Neutro Abs 8.3 (H) 1.4 - 6.5 K/uL   Lymphocytes Relative 17 %   Lymphs Abs 1.9 1.0 - 3.6 K/uL   Monocytes Relative 7 %   Monocytes Absolute 0.8 0.2 - 1.0 K/uL   Eosinophils Relative 2 %   Eosinophils Absolute 0.2 0 - 0.7 K/uL   Basophils Relative 0 %   Basophils Absolute 0.0 0 - 0.1 K/uL  Comprehensive metabolic panel     Status: Abnormal   Collection Time: 12/30/17  3:35 PM  Result Value Ref Range   Sodium 138 135 - 145 mmol/L   Potassium 3.8 3.5 - 5.1 mmol/L   Chloride 105 98 - 111 mmol/L   CO2 24 22 - 32 mmol/L   Glucose, Bld 170 (H) 70 - 99 mg/dL   BUN 8 6 - 20 mg/dL   Creatinine, Ser 7.82 0.61 - 1.24 mg/dL   Calcium 8.9 8.9 - 95.6 mg/dL   Total Protein 6.9 6.5 - 8.1 g/dL   Albumin 4.1 3.5 - 5.0 g/dL   AST 28 15 - 41 U/L   ALT 28 0 - 44 U/L   Alkaline Phosphatase 121 38 - 126 U/L   Total Bilirubin 1.3 (H) 0.3 - 1.2 mg/dL   GFR calc non Af Amer >60 >60 mL/min   GFR calc Af Amer >60 >60 mL/min   Anion gap 9 5 - 15   ____________________________________________ ____________________________________________  RADIOLOGY  I personally reviewed all radiographic images ordered to evaluate for the above acute complaints and reviewed radiology reports and findings.  These findings were personally discussed with the patient.  Please see medical record for radiology report.  ____________________________________________   PROCEDURES  Procedure(s) performed:  Procedures    Critical Care performed:  no ____________________________________________   INITIAL IMPRESSION / ASSESSMENT AND PLAN / ED COURSE  Pertinent labs & imaging results that were available during my care of the patient were reviewed by me and considered in my medical decision making (see chart for details).   DDX: Posterior, cellulitis, wound dehiscence, necrotizing soft tissue infection, ischemic limb  Brent Case is a 42 y.o. who presents to the ED with evidence of cellulitis related to chronic wound to the right great toe status post amputation.  Patient afebrile Heema dynamically stable.  Mildly elevated white count certainly no evidence of sepsis.  Blood sugar is mildly elevated but no evidence of acidosis.  Patient has good distal perfusion and is not clinically consistent with ischemic limb.  Will start patient on Augmentin.  Patient has follow-up with wound care tomorrow morning ensuring adequate close follow-up and repeat exam.  Will obtain wound culture as well and sent to lab.  Have discussed with the patient and available family all diagnostics and treatments performed thus far and all questions were answered to the best of my ability. The patient demonstrates understanding and agreement with plan.       As part of my medical decision making, I reviewed the following data within the electronic MEDICAL RECORD NUMBER Nursing notes reviewed and incorporated, Labs reviewed, notes from prior ED visits.   ____________________________________________   FINAL CLINICAL IMPRESSION(S) / ED DIAGNOSES  Final diagnoses:  Cellulitis of right lower extremity      NEW MEDICATIONS STARTED DURING THIS VISIT:  New Prescriptions   AMOXICILLIN-CLAVULANATE (AUGMENTIN) 875-125 MG TABLET    Take 1 tablet by mouth 2 (two) times daily for 7 days.     Note:  This document was prepared using Dragon voice recognition software and may include unintentional dictation errors.    Willy Eddy, MD 12/30/17 [phone]

## 2017-12-30 NOTE — ED Notes (Signed)
Pt had right great toe amputated.  Pt dropped a chainsaw on right foot 7-28.  Toe got infected and pt had toe removed.    Pt continues to have pain and goes to wound clinic.  Area red, painful to touch.  No drainage noted.  No fever.

## 2017-12-30 NOTE — ED Triage Notes (Signed)
States had great toe R foot amputated one month ago. States was due to chain saw accident. Has been seeing wound specialist. States now looking reddened and more painful x 2 days.

## 2018-01-02 LAB — AEROBIC CULTURE  (SUPERFICIAL SPECIMEN): SPECIAL REQUESTS: NORMAL

## 2018-01-02 LAB — AEROBIC CULTURE W GRAM STAIN (SUPERFICIAL SPECIMEN)

## 2018-01-03 NOTE — Progress Notes (Signed)
ED Antimicrobial Stewardship Positive Culture Follow Up   Brent Case is an 42 y.o. male who presented to [facility] on 12/30/2017 with a chief complaint of  Chief Complaint  Patient presents with  . Post-op Problem    Recent Results (from the past 720 hour(s))  Wound or Superficial Culture     Status: None   Collection Time: 12/30/17  6:46 PM  Result Value Ref Range Status   Specimen Description   Final    TOE RIGHT Performed at [facility], [address], [place], [place] [address]    Special Requests   Final    Normal Performed at [facility], [address] [place], [place] [address]    Gram Stain   Final    ABUNDANT WBC PRESENT, PREDOMINANTLY PMN RARE GRAM NEGATIVE RODS RARE GRAM POSITIVE COCCI    Culture   Final    FEW PSEUDOMONAS AERUGINOSA FEW NORMAL SKIN FLORA Performed at [facility], [address], [place], [place] [address]    Report Status 01/02/2018 FINAL  Final   Organism ID, Bacteria PSEUDOMONAS AERUGINOSA  Final      Susceptibility   Pseudomonas aeruginosa - MIC*    CEFTAZIDIME 4 SENSITIVE Sensitive     CIPROFLOXACIN <=0.25 SENSITIVE Sensitive     GENTAMICIN <=1 SENSITIVE Sensitive     IMIPENEM 2 SENSITIVE Sensitive     PIP/TAZO 8 SENSITIVE Sensitive     CEFEPIME 2 SENSITIVE Sensitive     * FEW PSEUDOMONAS AERUGINOSA    [x]  Treated with Augmentin 875 BID for 7 days, organism resistant to prescribed antimicrobial []  Patient discharged originally without antimicrobial agent and treatment is now indicated  New antibiotic prescription: Left message at home phone number listed and requested call back.  Per ED note patient was to be following up with wound clinic on 10/01.  Need to verify if they did and if they were placed on something other than Augmentin.  If reached, Cipro 750 mg every 12 hours for 14 days was approved by Dr. Shaune Pollack for substitution.  ED Provider: Dr. Marlow Baars 01/03/2018, 1:58 PM Clinical  Pharmacist

## 2018-01-24 ENCOUNTER — Emergency Department
Admission: EM | Admit: 2018-01-24 | Discharge: 2018-01-24 | Disposition: A | Discharge status: Home / Self Care | Payer: Medicaid Other | Attending: Emergency Medicine | Admitting: Emergency Medicine

## 2018-01-24 ENCOUNTER — Encounter: Payer: Self-pay | Admitting: Emergency Medicine

## 2018-01-24 ENCOUNTER — Emergency Department: Payer: Medicaid Other

## 2018-01-24 ENCOUNTER — Other Ambulatory Visit: Payer: Self-pay

## 2018-01-24 DIAGNOSIS — Y929 Unspecified place or not applicable: Secondary | ICD-10-CM | POA: Insufficient documentation

## 2018-01-24 DIAGNOSIS — F17210 Nicotine dependence, cigarettes, uncomplicated: Secondary | ICD-10-CM | POA: Insufficient documentation

## 2018-01-24 DIAGNOSIS — Y9301 Activity, walking, marching and hiking: Secondary | ICD-10-CM | POA: Insufficient documentation

## 2018-01-24 DIAGNOSIS — W228XXA Striking against or struck by other objects, initial encounter: Secondary | ICD-10-CM | POA: Insufficient documentation

## 2018-01-24 DIAGNOSIS — J45909 Unspecified asthma, uncomplicated: Secondary | ICD-10-CM | POA: Insufficient documentation

## 2018-01-24 DIAGNOSIS — Y999 Unspecified external cause status: Secondary | ICD-10-CM | POA: Insufficient documentation

## 2018-01-24 DIAGNOSIS — S63616A Unspecified sprain of right little finger, initial encounter: Secondary | ICD-10-CM | POA: Insufficient documentation

## 2018-01-24 DIAGNOSIS — R739 Hyperglycemia, unspecified: Secondary | ICD-10-CM | POA: Insufficient documentation

## 2018-01-24 HISTORY — DX: Unspecified asthma, uncomplicated: J45.909

## 2018-01-24 LAB — BASIC METABOLIC PANEL
Anion gap: 9 (ref 5–15)
BUN: 16 mg/dL (ref 6–20)
CHLORIDE: 107 mmol/L (ref 98–111)
CO2: 24 mmol/L (ref 22–32)
CREATININE: 1.21 mg/dL (ref 0.61–1.24)
Calcium: 9 mg/dL (ref 8.9–10.3)
GFR calc Af Amer: >60 mL/min (ref >60)
GFR calc non Af Amer: >60 mL/min (ref >60)
Glucose, Bld: 109 mg/dL — ABNORMAL HIGH (ref 70–99)
Potassium: 4 mmol/L (ref 3.5–5.1)
Sodium: 140 mmol/L (ref 135–145)

## 2018-01-24 LAB — CBC
HEMATOCRIT: 44.8 % (ref 39.0–52.0)
Hemoglobin: 15.4 g/dL (ref 13.0–17.0)
MCH: 30.7 pg (ref 26.0–34.0)
MCHC: 34.4 g/dL (ref 30.0–36.0)
MCV: 89.2 fL (ref 80.0–100.0)
NRBC: 0 % (ref 0.0–0.2)
Platelets: 220 10*3/uL (ref 150–400)
RBC: 5.02 MIL/uL (ref 4.22–5.81)
RDW: 13.2 % (ref 11.5–15.5)
WBC: 10.3 10*3/uL (ref 4.0–10.5)

## 2018-01-24 LAB — URINALYSIS, COMPLETE (UACMP) WITH MICROSCOPIC
BACTERIA UA: NONE SEEN
BILIRUBIN URINE: NEGATIVE
Glucose, UA: NEGATIVE mg/dL
Hgb urine dipstick: NEGATIVE
Ketones, ur: NEGATIVE mg/dL
LEUKOCYTES UA: NEGATIVE
Nitrite: NEGATIVE
PROTEIN: NEGATIVE mg/dL
Specific Gravity, Urine: 1.025 (ref 1.005–1.030)
pH: 5 (ref 5.0–8.0)

## 2018-01-24 LAB — GLUCOSE, CAPILLARY: GLUCOSE-CAPILLARY: 117 mg/dL — AB (ref 70–99)

## 2018-01-24 NOTE — ED Notes (Signed)
Pt reports had right great toe amputated  this past July due to diabetes, but has not follow with a doctor ever since

## 2018-01-24 NOTE — ED Triage Notes (Signed)
Pt reports about a week a go he hurt his right 5th digit reports hit it against the door, pt also reports his blood sugar his morning was 263 reports he has a glucometer but does not any medication for it reports has never seen a doctor for blood sugar. Pt denies any other symptom

## 2018-01-24 NOTE — ED Provider Notes (Signed)
[facility] Emergency Department Provider Note  ___________________________________________   First MD Initiated Contact with Patient 01/24/18 2142     (approximate)  I have reviewed the triage vital signs and the nursing notes.   HISTORY  Chief Complaint Hand Pain and Blood Sugar Problem   HPI Brent Case is a 42 y.o. male who is presenting to the emergency department complaining of right small finger pain as well as elevated blood sugar.  He says that he hit the right small finger against a door frame and bent it back approximately 1 week ago.  Ever since then he has had swelling to the medial right hand as well as pain with movement of the right small finger.  He also says that his glucose was elevated this morning at 263 and is concerned regarding his elevated sugar.  Also requesting a check of a wound of his right great toe.  Recent amputation after "dropping a chainsaw on this toe."  Does not report any weakness or numbness to the small finger.   Past Medical History:  Diagnosis Date  . Asthma     There are no active problems to display for this patient.   Past Surgical History:  Procedure Laterality Date  . ABDOMINAL SURGERY    . amputation right toe    . HERNIA REPAIR    . MANDIBLE FRACTURE SURGERY      Prior to Admission medications   Medication Sig Start Date End Date Taking? Authorizing Provider  amoxicillin (AMOXIL) 500 MG tablet Take 1 tablet (500 mg total) by mouth 3 (three) times daily. Patient not taking: Reported on 10/30/2017 08/08/17   Kem Boroughs B, FNP  cephALEXin (KEFLEX) 500 MG capsule Take 1 capsule (500 mg total) by mouth 3 (three) times daily. Patient not taking: Reported on 11/15/2017 10/30/17   Sharman Cheek, MD  Skin Protectants, Misc. (PERIGUARD) OINT Apply 1 application topically 2 (two) times daily. 08/08/17   Triplett, Cari B, FNP  traMADol (ULTRAM) 50 MG tablet Take 1 tablet (50 mg total) by mouth every 6 (six)  hours as needed. 10/30/17   Sharman Cheek, MD    Allergies Patient has no known allergies.  No family history on file.  Social History Social History   Tobacco Use  . Smoking status: Current Every Day Smoker    Packs/day: 2.00    Types: Cigarettes  . Smokeless tobacco: Never Used  Substance Use Topics  . Alcohol use: Not Currently  . Drug use: Not on file    Review of Systems  Constitutional: No fever/chills Eyes: No visual changes. ENT: No sore throat. Cardiovascular: Denies chest pain. Respiratory: Denies shortness of breath. Gastrointestinal: No abdominal pain.  No nausea, no vomiting.  No diarrhea.  No constipation. Genitourinary: Negative for dysuria. Musculoskeletal: Negative for back pain. Skin: Negative for rash. Neurological: Negative for headaches, focal weakness or numbness.   ____________________________________________   PHYSICAL EXAM:  VITAL SIGNS: ED Triage Vitals  Enc Vitals Group     BP 01/24/18 2114 (!) 142/70     Pulse Rate 01/24/18 2114 (!) 102     Resp 01/24/18 2114 20     Temp 01/24/18 2114 98.4 F (36.9 C)     Temp Source 01/24/18 2114 Oral     SpO2 01/24/18 2114 95 %     Weight 01/24/18 2116 240 lb (108.9 kg)     Height 01/24/18 2116 5\' 9"  (1.753 m)     Head Circumference --  Peak Flow --      Pain Score 01/24/18 2126 9     Pain Loc --      Pain Edu? --      Excl. in GC? --     Constitutional: Alert and oriented.  In no acute distress. Eyes: Conjunctivae are normal.  Head: Atraumatic. Nose: No congestion/rhinnorhea. Mouth/Throat: Mucous membranes are moist.  Neck: No stridor.   Cardiovascular: Normal rate, regular rhythm. Grossly normal heart sounds.  Respiratory: Normal respiratory effort.  No retractions. Lungs CTAB. Gastrointestinal: Soft and nontender. No distention.  Musculoskeletal:   Right small finger with full range of motion.  No deformity.  No rotation.  Brisk capillary refill distally.  No deformity.   Patient is concerned about swelling over the right hyperthenar eminence, medially.  However, there is no bony tenderness, no deformity, no ecchymosis.  Right great toe amputation site with mild surrounding erythema and soft, yellow to white appearing tissue centrally.  No exudate expressed.  Patient says that this is actually improved in appearance as he has been following with wound care.  No surrounding induration, crepitus or tenderness to palpation.  Neurologic:  Normal speech and language. No gross focal neurologic deficits are appreciated. Skin:  Skin is warm, dry and intact. No rash noted. Psychiatric: Mood and affect are normal. Speech and behavior are normal.  ____________________________________________   LABS (all labs ordered are listed, but only abnormal results are displayed)  Labs Reviewed  BASIC METABOLIC PANEL - Abnormal; Notable for the following components:      Result Value   Glucose, Bld 109 (*)    All other components within normal limits  URINALYSIS, COMPLETE (UACMP) WITH MICROSCOPIC - Abnormal; Notable for the following components:   Color, Urine YELLOW (*)    APPearance CLEAR (*)    All other components within normal limits  GLUCOSE, CAPILLARY - Abnormal; Notable for the following components:   Glucose-Capillary 117 (*)    All other components within normal limits  CBC  CBG MONITORING, ED   ____________________________________________  EKG   ____________________________________________  RADIOLOGY  Negative x-ray of the right small finger. ____________________________________________   PROCEDURES  Procedure(s) performed:   Procedures  Critical Care performed:   ____________________________________________   INITIAL IMPRESSION / ASSESSMENT AND PLAN / ED COURSE  Pertinent labs & imaging results that were available during my care of the patient were reviewed by me and considered in my medical decision making (see chart for details).  DDX:  Finger sprain, finger fracture, hyperglycemia, wound infection, cellulitis As part of my medical decision making, I reviewed the following data within the electronic MEDICAL RECORD NUMBER Notes from prior ED visits  Patient placed in finger splint for comfort.  Understands that he may take it off as his pain improves.  Wound improving per patient.  Normal white blood cell count.  Glucose reassuring at this time as well.  Patient be discharged home at this time.  Given follow-up with podiatry, hand surgery as well as primary care doctor. ____________________________________________   FINAL CLINICAL IMPRESSION(S) / ED DIAGNOSES  Finger sprain.  Hyperglycemia.  NEW MEDICATIONS STARTED DURING THIS VISIT:  New Prescriptions   No medications on file     Note:  This document was prepared using Dragon voice recognition software and may include unintentional dictation errors.     Myrna Blazer, MD 01/24/18 2322

## 2018-01-24 NOTE — ED Notes (Signed)
Patient called for treatment area. No Answer

## 2018-02-13 ENCOUNTER — Emergency Department
Admission: EM | Admit: 2018-02-13 | Discharge: 2018-02-13 | Disposition: A | Discharge status: Home / Self Care | Payer: Self-pay | Attending: Emergency Medicine | Admitting: Emergency Medicine

## 2018-02-13 DIAGNOSIS — Z79899 Other long term (current) drug therapy: Secondary | ICD-10-CM | POA: Insufficient documentation

## 2018-02-13 DIAGNOSIS — R300 Dysuria: Secondary | ICD-10-CM | POA: Insufficient documentation

## 2018-02-13 DIAGNOSIS — J45909 Unspecified asthma, uncomplicated: Secondary | ICD-10-CM | POA: Insufficient documentation

## 2018-02-13 DIAGNOSIS — F17210 Nicotine dependence, cigarettes, uncomplicated: Secondary | ICD-10-CM | POA: Insufficient documentation

## 2018-02-13 LAB — URINALYSIS, COMPLETE (UACMP) WITH MICROSCOPIC
Bacteria, UA: NONE SEEN
Bilirubin Urine: NEGATIVE
GLUCOSE, UA: NEGATIVE mg/dL
Hgb urine dipstick: NEGATIVE
KETONES UR: NEGATIVE mg/dL
Leukocytes, UA: NEGATIVE
NITRITE: NEGATIVE
PROTEIN: NEGATIVE mg/dL
SPECIFIC GRAVITY, URINE: 1.018 (ref 1.005–1.030)
pH: 5 (ref 5.0–8.0)

## 2018-02-13 LAB — CHLAMYDIA/NGC RT PCR (ARMC ONLY)
CHLAMYDIA TR: NOT DETECTED
N gonorrhoeae: NOT DETECTED

## 2018-02-13 MED ORDER — PHENAZOPYRIDINE HCL 200 MG PO TABS
ORAL_TABLET | ORAL | Status: AC
  Filled 2018-02-13: qty 1

## 2018-02-13 MED ORDER — PHENAZOPYRIDINE HCL 100 MG PO TABS: 100.0000 mg | ORAL_TABLET | Freq: Once | ORAL | Status: DC

## 2018-02-13 MED ORDER — PHENAZOPYRIDINE HCL 200 MG PO TABS
200.0000 mg | ORAL_TABLET | Freq: Once | ORAL | Status: AC
  Administered 2018-02-13: 200 mg via ORAL

## 2018-02-13 NOTE — ED Provider Notes (Signed)
[facility] Emergency Department Provider Note ________   First MD Initiated Contact with Patient 02/13/18 [phone]     (approximate)  I have reviewed the triage vital signs and the nursing notes.   HISTORY  Chief Complaint No chief complaint on file.    HPI Lucus Lambertson is a 42 y.o. male presents to the emergency department with dysuria x3 days.  Patient denies any fever nausea or vomiting.  Patient denies any hematuria.  Patient does admit to monogamous relationship with unprotected sex.   Past Medical History:  Diagnosis Date  . Asthma     There are no active problems to display for this patient.   Past Surgical History:  Procedure Laterality Date  . ABDOMINAL SURGERY    . amputation right toe    . HERNIA REPAIR    . MANDIBLE FRACTURE SURGERY      Prior to Admission medications   Medication Sig Start Date End Date Taking? Authorizing Provider  amoxicillin (AMOXIL) 500 MG tablet Take 1 tablet (500 mg total) by mouth 3 (three) times daily. Patient not taking: Reported on 10/30/2017 08/08/17   Kem Boroughs B, FNP  cephALEXin (KEFLEX) 500 MG capsule Take 1 capsule (500 mg total) by mouth 3 (three) times daily. Patient not taking: Reported on 11/15/2017 10/30/17   Sharman Cheek, MD  Skin Protectants, Misc. (PERIGUARD) OINT Apply 1 application topically 2 (two) times daily. 08/08/17   Triplett, Cari B, FNP  traMADol (ULTRAM) 50 MG tablet Take 1 tablet (50 mg total) by mouth every 6 (six) hours as needed. 10/30/17   Sharman Cheek, MD    Allergies No known drug allergies No family history on file.  Social History Social History   Tobacco Use  . Smoking status: Current Every Day Smoker    Packs/day: 2.00    Types: Cigarettes  . Smokeless tobacco: Never Used  Substance Use Topics  . Alcohol use: Not Currently  . Drug use: Not on file    Review of Systems Constitutional: No fever/chills Eyes: No visual changes. ENT: No sore  throat. Cardiovascular: Denies chest pain. Respiratory: Denies shortness of breath. Gastrointestinal: No abdominal pain.  No nausea, no vomiting.  No diarrhea.  No constipation. Genitourinary: Positive for dysuria. Musculoskeletal: Negative for neck pain.  Negative for back pain. Integumentary: Negative for rash. Neurological: Negative for headaches, focal weakness or numbness.  ____________________________________________   PHYSICAL EXAM:  VITAL SIGNS: ED Triage Vitals  Enc Vitals Group     BP      Pulse      Resp      Temp      Temp src      SpO2      Weight      Height      Head Circumference      Peak Flow      Pain Score      Pain Loc      Pain Edu?      Excl. in GC?     Constitutional: Alert and oriented. Well appearing and in no acute distress. Eyes: Conjunctivae are normal.  Head: Atraumatic. Mouth/Throat: Mucous membranes are moist.  Oropharynx non-erythematous. Neck: No stridor.   Cardiovascular: Normal rate, regular rhythm. Good peripheral circulation. Grossly normal heart sounds. Respiratory: Normal respiratory effort.  No retractions. Lungs CTAB. Gastrointestinal: Soft and nontender. No distention.  Musculoskeletal: No lower extremity tenderness nor edema. No gross deformities of extremities. Neurologic:  Normal speech and language. No gross focal neurologic  deficits are appreciated.  Skin:  Skin is warm, dry and intact. No rash noted. Psychiatric: Mood and affect are normal. Speech and behavior are normal.    Procedures   ____________________________________________   INITIAL IMPRESSION / ASSESSMENT AND PLAN / ED COURSE  As part of my medical decision making, I reviewed the following data within the electronic MEDICAL RECORD NUMBER 42 year old male presenting with above-stated history and physical exam secondary to dysuria.  Concern for possible UTI versus STI urinalysis revealed no evidence of UTI.  Gonorrhea and chlamydia negative.  Also considered  possibility of a chemically induced urethritis.  Patient given Pyridium in the emergency department. ____________________________________________  FINAL CLINICAL IMPRESSION(S) / ED DIAGNOSES  Final diagnoses:  Dysuria     MEDICATIONS GIVEN DURING THIS VISIT:  Medications - No data to display   ED Discharge Orders    None       Note:  This document was prepared using Dragon voice recognition software and may include unintentional dictation errors.    Darci CurrentBrown, [place] N, MD 02/13/18 [phone]

## 2018-05-06 ENCOUNTER — Emergency Department: Payer: Medicaid Other

## 2018-05-06 ENCOUNTER — Other Ambulatory Visit: Payer: Self-pay

## 2018-05-06 ENCOUNTER — Encounter: Payer: Self-pay | Admitting: Emergency Medicine

## 2018-05-06 DIAGNOSIS — M5412 Radiculopathy, cervical region: Secondary | ICD-10-CM | POA: Insufficient documentation

## 2018-05-06 DIAGNOSIS — Z79899 Other long term (current) drug therapy: Secondary | ICD-10-CM | POA: Insufficient documentation

## 2018-05-06 DIAGNOSIS — F17210 Nicotine dependence, cigarettes, uncomplicated: Secondary | ICD-10-CM | POA: Insufficient documentation

## 2018-05-06 DIAGNOSIS — J45909 Unspecified asthma, uncomplicated: Secondary | ICD-10-CM | POA: Insufficient documentation

## 2018-05-06 LAB — URINALYSIS, COMPLETE (UACMP) WITH MICROSCOPIC
BILIRUBIN URINE: NEGATIVE
Bacteria, UA: NONE SEEN
Glucose, UA: NEGATIVE mg/dL
HGB URINE DIPSTICK: NEGATIVE
Ketones, ur: NEGATIVE mg/dL
LEUKOCYTES UA: NEGATIVE
Nitrite: NEGATIVE
PH: 6 (ref 5.0–8.0)
Protein, ur: NEGATIVE mg/dL
SPECIFIC GRAVITY, URINE: 1.017 (ref 1.005–1.030)

## 2018-05-06 LAB — CBC
HEMATOCRIT: 44.7 % (ref 39.0–52.0)
HEMOGLOBIN: 15.5 g/dL (ref 13.0–17.0)
MCH: 31 pg (ref 26.0–34.0)
MCHC: 34.7 g/dL (ref 30.0–36.0)
MCV: 89.4 fL (ref 80.0–100.0)
Platelets: 230 10*3/uL (ref 150–400)
RBC: 5 MIL/uL (ref 4.22–5.81)
RDW: 12.9 % (ref 11.5–15.5)
WBC: 11.6 10*3/uL — ABNORMAL HIGH (ref 4.0–10.5)
nRBC: 0 % (ref 0.0–0.2)

## 2018-05-06 LAB — BASIC METABOLIC PANEL
ANION GAP: 7 (ref 5–15)
BUN: 19 mg/dL (ref 6–20)
CHLORIDE: 105 mmol/L (ref 98–111)
CO2: 26 mmol/L (ref 22–32)
Calcium: 9.4 mg/dL (ref 8.9–10.3)
Creatinine, Ser: 1.17 mg/dL (ref 0.61–1.24)
GFR calc Af Amer: >60 mL/min (ref >60)
GLUCOSE: 102 mg/dL — AB (ref 70–99)
POTASSIUM: 4 mmol/L (ref 3.5–5.1)
Sodium: 138 mmol/L (ref 135–145)

## 2018-05-06 LAB — TROPONIN I: Troponin I: <0.03 ng/mL (ref <0.03)

## 2018-05-06 NOTE — ED Triage Notes (Addendum)
Patient to ER for c/o generalized weakness. Patient reports feeling right hand/arm is weaker, but has good grip strengths that are equal, no drift of arms. States this symptoms has been going on intermittently "for a while" (definitely more than a week). No facial droop present. 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 Patient states arm gets intermittently worse, feels flaccid when it happens.

## 2018-05-07 ENCOUNTER — Emergency Department
Admission: EM | Admit: 2018-05-07 | Discharge: 2018-05-07 | Disposition: A | Discharge status: Home / Self Care | Payer: Medicaid Other | Attending: Emergency Medicine | Admitting: Emergency Medicine

## 2018-05-07 DIAGNOSIS — M5412 Radiculopathy, cervical region: Secondary | ICD-10-CM

## 2018-05-07 MED ORDER — CYCLOBENZAPRINE HCL 5 MG PO TABS: 5.0000 mg | ORAL_TABLET | Freq: Three times a day (TID) | ORAL | 0 refills | Status: AC | PRN

## 2018-05-07 MED ORDER — IBUPROFEN 600 MG PO TABS: ORAL_TABLET | ORAL | 0 refills | Status: AC

## 2018-05-07 NOTE — ED Provider Notes (Signed)
[facility] Emergency Department Provider Note  ____________________________________________   First MD Initiated Contact with Patient 05/07/18 [phone]     (approximate)  I have reviewed the triage vital signs and the nursing notes.   HISTORY  Chief Complaint Weakness    HPI Brent Case is a 43 y.o. male with medical history as listed below who presents for evaluation of intermittent weakness and pain in his right arm.  History is somewhat limited by being a vague historian and not answering questions directly.  He is not able to tell me how [place] it has been going on.  I asked him at least 4 times and try to give him arranges, such as a week, month, etc., and he never answered the question, but it sounds as if this is been going on for an extended period of time.  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  The symptoms seem to occur more often in the evenings after he has been working all day.  He has pain that radiates down from his right shoulder or neck down to his hand with some accompanying weakness.  The symptoms are inconsistent and nothing in particular makes them better or worse.  He has no other weakness anywhere in his body.  He has a number of complaints of chronic back pain and aches and pains throughout his body but nothing in particular.  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  Right now he is not having any symptoms.  He describes them as severe at times.  Past Medical History:  Diagnosis Date  . Asthma     There are no active problems to display for this patient.   Past Surgical History:  Procedure Laterality Date  . ABDOMINAL SURGERY    . amputation right toe    . HERNIA REPAIR    . MANDIBLE FRACTURE SURGERY      Prior to Admission medications     Medication Sig Start Date End Date Taking? Authorizing Provider  amoxicillin (AMOXIL) 500 MG tablet Take 1 tablet (500 mg total) by mouth 3 (three) times daily. Patient not taking: Reported on 10/30/2017 08/08/17   Kem Boroughsriplett, Cari B, FNP  cephALEXin (KEFLEX) 500 MG capsule Take 1 capsule (500 mg total) by mouth 3 (three) times daily. Patient not taking: Reported on 11/15/2017 10/30/17   Sharman CheekStafford, Phillip, MD  cyclobenzaprine (FLEXERIL) 5 MG tablet Take 1 tablet (5 mg total) by mouth 3 (three) times daily as needed for muscle spasms. 05/07/18   Loleta RoseForbach, Elzie Sheets, MD  ibuprofen (ADVIL,MOTRIN) 600 MG tablet Take 1 tablet by mouth three times daily with meals 05/07/18   Loleta RoseForbach, Jalynn Waddell, MD  Skin Protectants, Misc. (PERIGUARD) OINT Apply 1 application topically 2 (two) times daily. 08/08/17   Triplett, Cari B, FNP  traMADol (ULTRAM) 50 MG tablet Take 1 tablet (50 mg total) by mouth every 6 (six) hours as needed. 10/30/17   Sharman CheekStafford, Phillip, MD    Allergies Patient has no known allergies.  No family history on file.  Social History Social History   Tobacco Use  . Smoking status: Current Every Day Smoker    Packs/day: 2.00    Types: Cigarettes  . Smokeless tobacco: Never Used  Substance Use Topics  . Alcohol use: Not Currently  . Drug use: Not  on file    Review of Systems Constitutional: No fever/chills Eyes: No visual changes. ENT: No sore throat. Cardiovascular: Denies chest pain. Respiratory: Denies shortness of breath. Gastrointestinal: No abdominal pain.  No nausea, no vomiting.  No diarrhea.  No constipation. Genitourinary: Negative for dysuria. Musculoskeletal: Intermittent pain and weakness in his right arm as described above.  Negative for neck pain.  Negative for back pain. Integumentary: Negative for rash. Neurological: Intermittent pain and weakness in his right arm as described above.  Negative for headaches, focal weakness or  numbness.   ____________________________________________   PHYSICAL EXAM:  VITAL SIGNS: ED Triage Vitals  Enc Vitals Group     BP 05/06/18 2146 139/79     Pulse Rate 05/06/18 2146 89     Resp 05/06/18 2146 20     Temp 05/06/18 2146 98.3 F (36.8 C)     Temp Source 05/06/18 2146 Oral     SpO2 05/06/18 2146 96 %     Weight 05/06/18 2146 108.9 kg (240 lb)     Height 05/06/18 2146 1.753 m (5\' 9" )     Head Circumference --      Peak Flow --      Pain Score 05/06/18 2152 0     Pain Loc --      Pain Edu? --      Excl. in GC? --     Constitutional: Alert and oriented. Well appearing and in no acute distress. Eyes: Conjunctivae are normal.  Head: Atraumatic. Nose: No congestion/rhinnorhea. Mouth/Throat: Mucous membranes are moist. Neck: No stridor.  No meningeal signs.  No cervical spine tenderness to palpation. Cardiovascular: Normal rate, regular rhythm. Good peripheral circulation. Grossly normal heart sounds. Respiratory: Normal respiratory effort.  No retractions. Lungs CTAB. Gastrointestinal: Soft and nontender. No distention.  Musculoskeletal: No lower extremity tenderness nor edema. No gross deformities of extremities. Neurologic:  Normal speech and language. No gross focal neurologic deficits are appreciated.  He is very strong in terms of grip strength bilaterally, flexion and extension of his elbow, and shoulder shrug.  When he holds his arms out horizontally with elbows flexed and with his fist touching, he has a hard time keeping his right elbow in place and resisting my downward pressure, but otherwise he is neurologically intact.  He also notes that that reproduces some pain. Skin: Very extensive tattooing throughout his body including his face.  Skin is warm, dry and intact. No rash noted. Psychiatric: Mood and affect are normal. Speech and behavior are normal.  ____________________________________________   LABS (all labs ordered are listed, but only abnormal  results are displayed)  Labs Reviewed  BASIC METABOLIC PANEL - Abnormal; Notable for the following components:      Result Value   Glucose, Bld 102 (*)    All other components within normal limits  CBC - Abnormal; Notable for the following components:   WBC 11.6 (*)    All other components within normal limits  URINALYSIS, COMPLETE (UACMP) WITH MICROSCOPIC - Abnormal; Notable for the following components:   Color, Urine YELLOW (*)    APPearance HAZY (*)    All other components within normal limits  TROPONIN I   ____________________________________________  EKG  ED ECG REPORT I, Loleta Rose, the attending physician, personally viewed and interpreted this ECG.  Date: 05/06/2018 EKG Time: 2200 Rate: 78 Rhythm: normal sinus rhythm QRS Axis: normal Intervals: normal ST/T Wave abnormalities: Inverted T waves in lead III, otherwise unremarkable Narrative Interpretation: no evidence of acute ischemia  ____________________________________________  RADIOLOGY   ED MD interpretation: No indication of acute abnormality on head CT  Official radiology report(s): Ct Head Wo Contrast  Result Date: 05/06/2018 CLINICAL DATA:  43 year old male with generalized weakness, perhaps greater on the right. EXAM: CT HEAD WITHOUT CONTRAST TECHNIQUE: Contiguous axial images were obtained from the base of the skull through the vertex without intravenous contrast. COMPARISON:  None. FINDINGS: Brain: Normal cerebral volume. No midline shift, ventriculomegaly, mass effect, evidence of mass lesion, intracranial hemorrhage or evidence of cortically based acute infarction. Gray-white matter differentiation is within normal limits throughout the brain. Vascular: No suspicious intracranial vascular hyperdensity. Skull: Negative. Partially visible mandible ORIF on the scalp view. Sinuses/Orbits: Visualized paranasal sinuses and mastoids are well pneumatized. Other: Visualized orbit soft tissues are within normal  limits. Visualized scalp soft tissues are within normal limits. IMPRESSION: Normal noncontrast head CT. Electronically Signed   By: Odessa FlemingH  Hall M.D.   On: 05/06/2018 22:16    ____________________________________________   PROCEDURES  Critical Care performed: No   Procedure(s) performed:   Procedures   ____________________________________________   INITIAL IMPRESSION / ASSESSMENT AND PLAN / ED COURSE  As part of my medical decision making, I reviewed the following data within the electronic MEDICAL RECORD NUMBER Nursing notes reviewed and incorporated, Labs reviewed , EKG interpreted , Old chart reviewed, Notes from prior ED visits and [place] Controlled Substance Database    Differential diagnosis includes, but is not limited to, cervical radiculopathy, nonspecific musculoskeletal or tendon injury to the right upper extremity, CVA.  I could not get a good timeframe from the patient but it sounds as that this is been going on for an extended period of time and it is reassuring that he has no acute abnormalities identified on his head CT.  He has no neurological deficits at this time other than some pain and weakness that are reproduced as described above, but he has good grip strength and muscle strength throughout the right lower extremity almost without exception.  Because of the description of his symptoms I think most likely he is suffering from some cervical radiculopathy.  Of note he is very anxious to leave because he has to be up and about 4 hours to go back to work and given that I can find no evidence of emergent medical condition at this time I think it is appropriate for discharge and outpatient follow-up.  I gave him a prescription for Flexeril and encouraged the use of ibuprofen with meals and I gave him the follow-up information with Dr. Adriana Simasook with neurosurgery.  His EKG is reassuring, he has normal vital signs, and no clinically significant lab abnormalities with a normal CBC, BMP, troponin,  and urinalysis.  He is comfortable with the plan.  I gave my usual and customary return precautions.     ____________________________________________  FINAL CLINICAL IMPRESSION(S) / ED DIAGNOSES  Final diagnoses:  Cervical radiculopathy     MEDICATIONS GIVEN DURING THIS VISIT:  Medications - No data to display   ED Discharge Orders         Ordered    cyclobenzaprine (FLEXERIL) 5 MG tablet  3 times daily PRN     05/07/18 0107    ibuprofen (ADVIL,MOTRIN) 600 MG tablet     05/07/18 0107           Note:  This document was prepared using Dragon voice recognition software and may include unintentional dictation errors.   Loleta RoseForbach, Saryna Kneeland, MD 05/07/18 [phone]

## 2018-05-07 NOTE — Discharge Instructions (Signed)
As we discussed, your work-up today was reassuring.  There is no clear cause of your symptoms identified, but it is most likely an issue with the nerves that go from your neck down your right arm.  Please try the medication prescribed as needed and follow-up with Dr. Adriana Simas or one of his colleagues with neurosurgery.  They may have some additional recommendations and options available for you.  Return to the emergency department if you develop new or worsening symptoms that concern you.

## 2018-05-08 ENCOUNTER — Other Ambulatory Visit: Payer: Self-pay

## 2018-05-08 ENCOUNTER — Encounter: Payer: Self-pay | Admitting: Emergency Medicine

## 2018-05-08 ENCOUNTER — Emergency Department
Admission: EM | Admit: 2018-05-08 | Discharge: 2018-05-08 | Disposition: A | Discharge status: Home / Self Care | Payer: Medicaid Other | Attending: Emergency Medicine | Admitting: Emergency Medicine

## 2018-05-08 DIAGNOSIS — Y999 Unspecified external cause status: Secondary | ICD-10-CM | POA: Insufficient documentation

## 2018-05-08 DIAGNOSIS — J45909 Unspecified asthma, uncomplicated: Secondary | ICD-10-CM | POA: Insufficient documentation

## 2018-05-08 DIAGNOSIS — Z79899 Other long term (current) drug therapy: Secondary | ICD-10-CM | POA: Insufficient documentation

## 2018-05-08 DIAGNOSIS — Y929 Unspecified place or not applicable: Secondary | ICD-10-CM | POA: Insufficient documentation

## 2018-05-08 DIAGNOSIS — Y939 Activity, unspecified: Secondary | ICD-10-CM | POA: Insufficient documentation

## 2018-05-08 DIAGNOSIS — S60413A Abrasion of left middle finger, initial encounter: Secondary | ICD-10-CM | POA: Insufficient documentation

## 2018-05-08 DIAGNOSIS — T148XXA Other injury of unspecified body region, initial encounter: Secondary | ICD-10-CM

## 2018-05-08 DIAGNOSIS — F17210 Nicotine dependence, cigarettes, uncomplicated: Secondary | ICD-10-CM | POA: Insufficient documentation

## 2018-05-08 DIAGNOSIS — W228XXA Striking against or struck by other objects, initial encounter: Secondary | ICD-10-CM | POA: Insufficient documentation

## 2018-05-08 MED ORDER — LIDOCAINE HCL (PF) 1 % IJ SOLN
5.0000 mL | Freq: Once | INTRAMUSCULAR | Status: AC
  Administered 2018-05-08: 5 mL via INTRADERMAL
  Filled 2018-05-08: qty 5

## 2018-05-08 NOTE — ED Notes (Signed)
Patient discharged to home per MD order. Patient in stable condition, and deemed medically cleared by ED provider for discharge. Discharge instructions reviewed with patient/family using "Teach Back"; verbalized understanding of medication education and administration, and information about follow-up care. Denies further concerns. ° °

## 2018-05-08 NOTE — ED Triage Notes (Signed)
Splinter under left 2nd finger - pt has cut nail and attempted to remove without success.

## 2018-05-08 NOTE — Discharge Instructions (Addendum)
Follow-up with your regular doctor if not better in 3 days.  Return emergency department worsening.  If you feel there is still retained foreign body you can return here or see your doctor.

## 2018-05-08 NOTE — ED Notes (Signed)
Sterile dressing applied

## 2018-05-08 NOTE — ED Provider Notes (Signed)
[facility] Emergency Department Provider Note  ____________________________________________   First MD Initiated Contact with Patient 05/08/18 1920     (approximate)  I have reviewed the triage vital signs and the nursing notes.   HISTORY  Chief Complaint Foreign Body in Skin    HPI Brent Case is a 43 y.o. male presents emergency department complaining of a splinter underneath his fingernail.  It is on the left second finger.  He is even cut his nail back to try and remove it without success.  Tdap is up-to-date.    Past Medical History:  Diagnosis Date  . Asthma     There are no active problems to display for this patient.   Past Surgical History:  Procedure Laterality Date  . ABDOMINAL SURGERY    . amputation right toe    . HERNIA REPAIR    . MANDIBLE FRACTURE SURGERY      Prior to Admission medications   Medication Sig Start Date End Date Taking? Authorizing Provider  cyclobenzaprine (FLEXERIL) 5 MG tablet Take 1 tablet (5 mg total) by mouth 3 (three) times daily as needed for muscle spasms. 05/07/18   Loleta Rose, MD  ibuprofen (ADVIL,MOTRIN) 600 MG tablet Take 1 tablet by mouth three times daily with meals 05/07/18   Loleta Rose, MD  Skin Protectants, Misc. (PERIGUARD) OINT Apply 1 application topically 2 (two) times daily. 08/08/17   Triplett, Cari B, FNP  traMADol (ULTRAM) 50 MG tablet Take 1 tablet (50 mg total) by mouth every 6 (six) hours as needed. 10/30/17   Sharman Cheek, MD    Allergies Patient has no known allergies.  History reviewed. No pertinent family history.  Social History Social History   Tobacco Use  . Smoking status: Current Every Day Smoker    Packs/day: 2.00    Types: Cigarettes  . Smokeless tobacco: Never Used  Substance Use Topics  . Alcohol use: Not Currently  . Drug use: Not on file    Review of Systems  Constitutional: No fever/chills Eyes: No visual changes. ENT: No sore  throat. Respiratory: Denies cough Genitourinary: Negative for dysuria. Musculoskeletal: Negative for back pain. Skin: Negative for rash.  Positive for a splinter underneath his fingernail    ____________________________________________   PHYSICAL EXAM:  VITAL SIGNS: ED Triage Vitals  Enc Vitals Group     BP 05/08/18 1852 (!) 132/93     Pulse Rate 05/08/18 1852 95     Resp 05/08/18 1852 20     Temp 05/08/18 1852 98.1 F (36.7 C)     Temp Source 05/08/18 1852 Oral     SpO2 05/08/18 1852 95 %     Weight 05/08/18 1853 240 lb (108.9 kg)     Height 05/08/18 1853 5\' 9"  (1.753 m)     Head Circumference --      Peak Flow --      Pain Score --      Pain Loc --      Pain Edu? --      Excl. in GC? --     Constitutional: Alert and oriented. Well appearing and in no acute distress. Eyes: Conjunctivae are normal.  Head: Atraumatic. Nose: No congestion/rhinnorhea. Mouth/Throat: Mucous membranes are moist.   Neck:  supple no lymphadenopathy noted Cardiovascular: Normal rate, regular rhythm. Respiratory: Normal respiratory effort.  No retractions, GU: deferred Musculoskeletal: FROM all extremities, warm and well perfused, positive for a splinter underneath the fingernail of the left second finger.  The area  has an abrasion noted with the patient has tried to get the splinter out himself. Neurologic:  Normal speech and language.  Skin:  Skin is warm, dry and intact. No rash noted. Psychiatric: Mood and affect are normal. Speech and behavior are normal.  ____________________________________________   LABS (all labs ordered are listed, but only abnormal results are displayed)  Labs Reviewed - No data to display ____________________________________________   ____________________________________________  RADIOLOGY    ____________________________________________   PROCEDURES  Procedure(s) performed: Verbal permission was given by the patient.  Splinter was removed with  forceps.  Digital block was performed prior to removal.  I used 1% Xylocaine.  Anesthesia was achieved.  The patient tolerated procedure well.  Procedures    ____________________________________________   INITIAL IMPRESSION / ASSESSMENT AND PLAN / ED COURSE  Pertinent labs & imaging results that were available during my care of the patient were reviewed by me and considered in my medical decision making (see chart for details).   Patient is 43 year old male presents emergency department for splinter removal.  There is a small splinter noted underneath the fingernail of the left second finger.  Foreign body was removed with forceps.  See procedure note  Patient tolerated procedure well.  A dressing was applied by nursing staff after the area had been soaked in saline and Betadine.  He is to follow-up with his regular doctor if any signs of infection or return the emergency department.  He states he does not want an antibiotic at this time.  He was discharged in stable condition.     As part of my medical decision making, I reviewed the following data within the electronic MEDICAL RECORD NUMBER Nursing notes reviewed and incorporated, Old chart reviewed, Notes from prior ED visits and [place] Controlled Substance Database  ____________________________________________   FINAL CLINICAL IMPRESSION(S) / ED DIAGNOSES  Final diagnoses:  Splinter in skin      NEW MEDICATIONS STARTED DURING THIS VISIT:  Current Discharge Medication List       Note:  This document was prepared using Dragon voice recognition software and may include unintentional dictation errors.    Faythe Ghee, PA-C 05/08/18 2316    Dionne Bucy, MD 05/08/18 [phone]

## 2018-05-08 NOTE — ED Notes (Signed)
Pt has splinter under finger and unable to get out at this time. No bleeding noted
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
## 2019-12-23 ENCOUNTER — Emergency Department: Payer: Medicaid Other

## 2019-12-23 ENCOUNTER — Other Ambulatory Visit: Payer: Self-pay

## 2019-12-23 ENCOUNTER — Emergency Department
Admission: EM | Admit: 2019-12-23 | Discharge: 2019-12-23 | Disposition: A | Discharge status: Home / Self Care | Payer: Medicaid Other | Attending: Emergency Medicine | Admitting: Emergency Medicine

## 2019-12-23 DIAGNOSIS — M25562 Pain in left knee: Secondary | ICD-10-CM | POA: Insufficient documentation

## 2019-12-23 DIAGNOSIS — M25571 Pain in right ankle and joints of right foot: Secondary | ICD-10-CM | POA: Insufficient documentation

## 2019-12-23 DIAGNOSIS — Z5321 Procedure and treatment not carried out due to patient leaving prior to being seen by health care provider: Secondary | ICD-10-CM | POA: Insufficient documentation

## 2019-12-23 NOTE — ED Triage Notes (Signed)
Pt arrives via pov, ambulatory to triage. Pt c/o left knee pain, and right ankle pain. Left knee has soft movable mass like a cyst on it, pt reports it has been there couple years. Denies injury to right ankle but c/o pain "feels like its going to snap off". NAD noted at this time

## 2019-12-25 IMAGING — CR DG FINGER LITTLE 2+V*R*
1 series · 3 of 3 positions shown · non-contrast
Comparison: None.

CLINICAL DATA: Pt reports about a week ago he hurt his right 5th
digit reports hit it against the door.

EXAM:
RIGHT LITTLE FINGER 2+V

[Series 1: dg finger little right · 0.14mm/px · 3 of 3 slices shown]
[im 1/3]
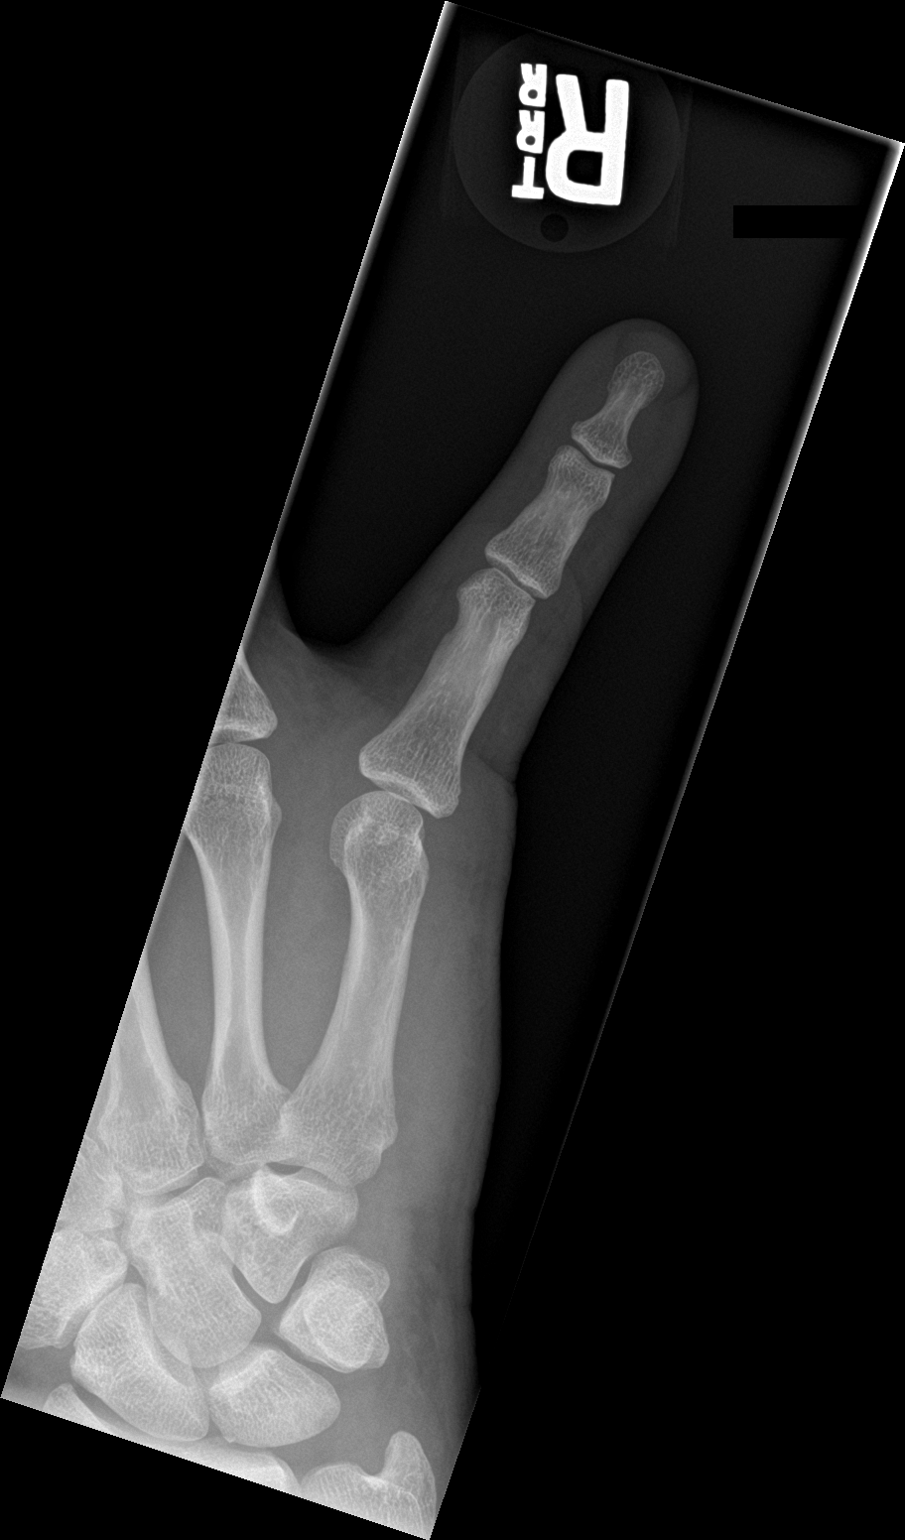
[im 2/3]
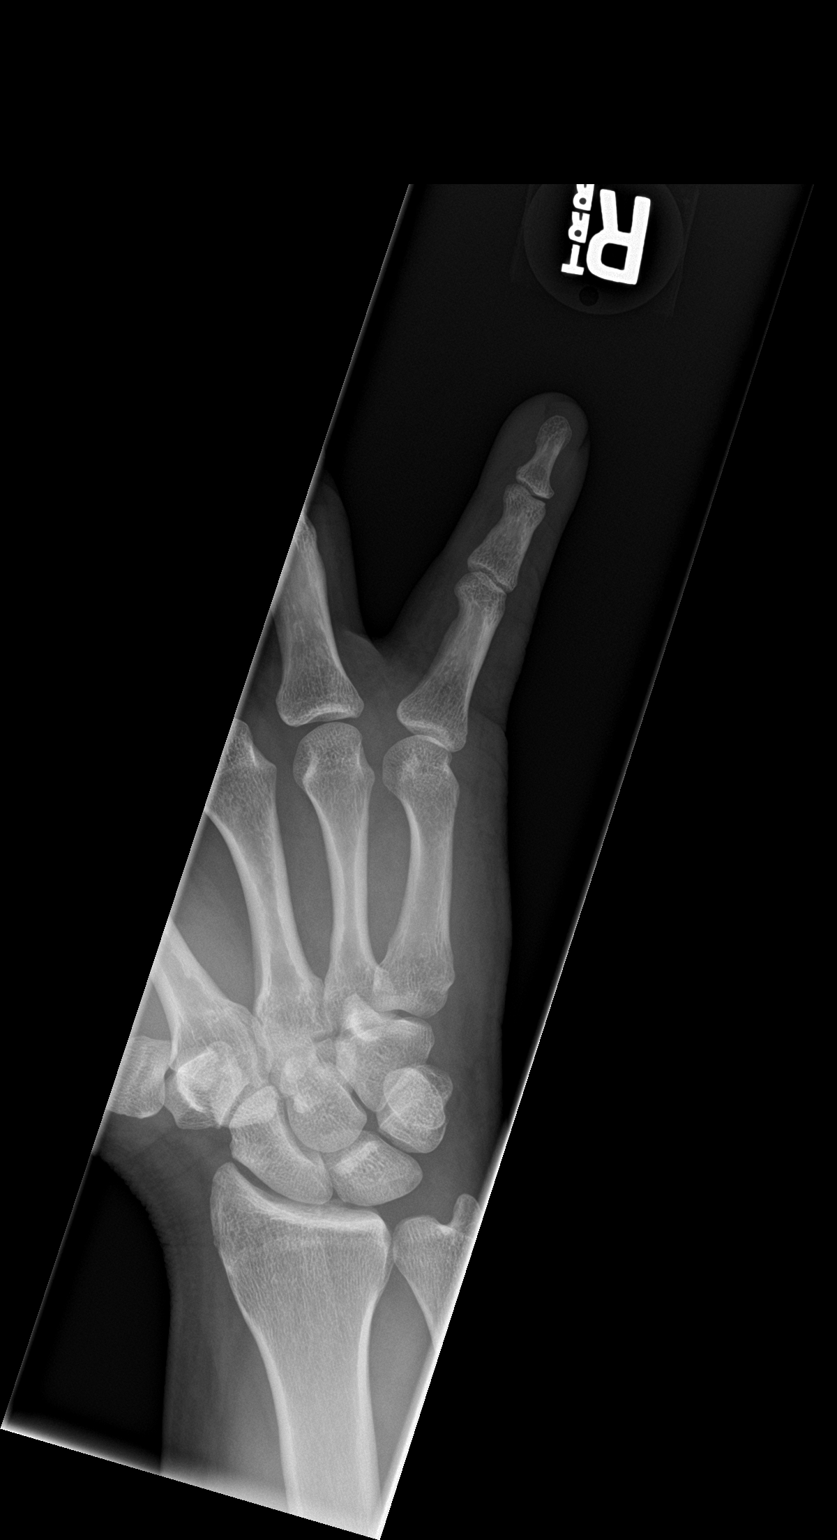
[im 3/3]
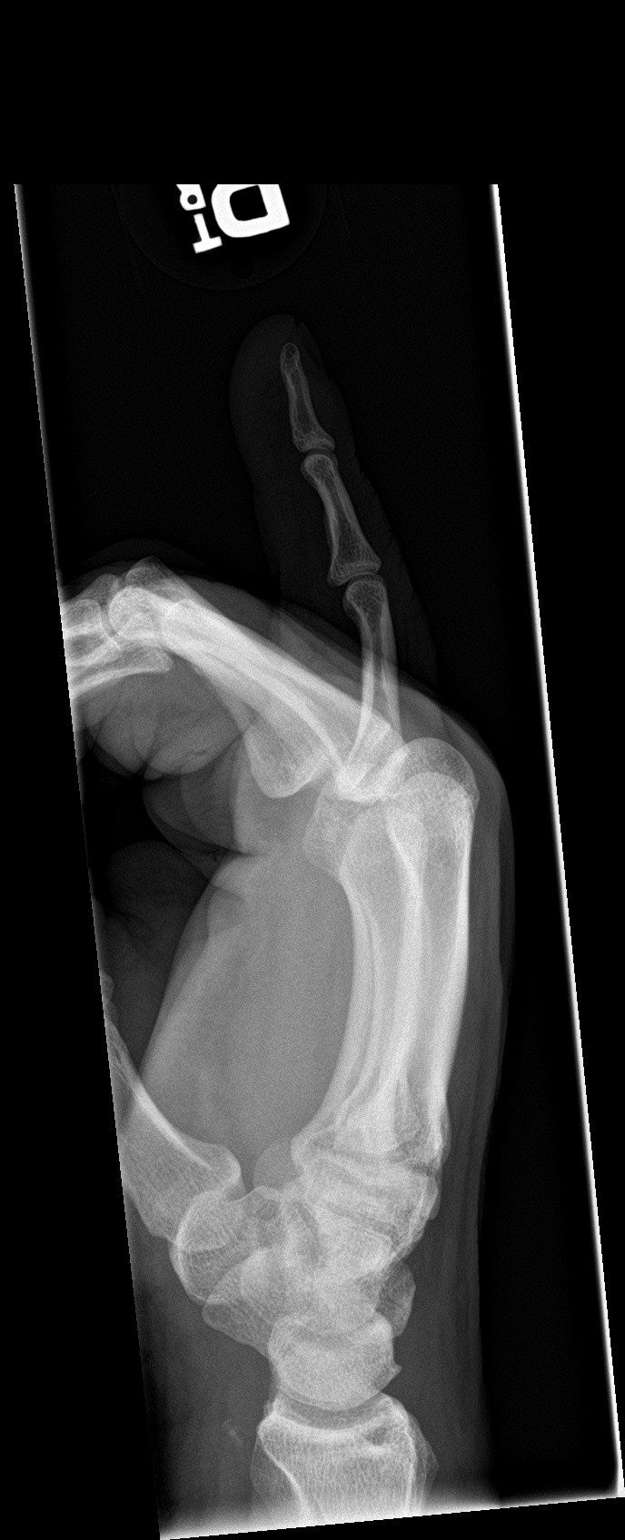

[3 of 3 positions shown; findings below may reference images not displayed]

FINDINGS: There is no evidence of fracture or dislocation. There is no
evidence of arthropathy or other focal bone abnormality. Soft
tissues are unremarkable.
IMPRESSION: Negative.

## 2020-08-29 ENCOUNTER — Other Ambulatory Visit: Payer: Self-pay

## 2020-08-29 ENCOUNTER — Emergency Department
Admission: EM | Admit: 2020-08-29 | Discharge: 2020-08-29 | Disposition: A | Discharge status: Home / Self Care | Payer: Medicaid Other | Attending: Emergency Medicine | Admitting: Emergency Medicine

## 2020-08-29 DIAGNOSIS — K0889 Other specified disorders of teeth and supporting structures: Secondary | ICD-10-CM | POA: Insufficient documentation

## 2020-08-29 DIAGNOSIS — Z5321 Procedure and treatment not carried out due to patient leaving prior to being seen by health care provider: Secondary | ICD-10-CM | POA: Insufficient documentation

## 2020-08-29 NOTE — ED Triage Notes (Signed)
Pt states his bottom teeth on the right side have been hurting him, hes had most of his teeth pulled due to infection.

## 2022-05-24 ENCOUNTER — Other Ambulatory Visit: Payer: Self-pay | Admitting: Podiatry

## 2022-05-24 DIAGNOSIS — G8929 Other chronic pain: Secondary | ICD-10-CM

## 2022-05-24 DIAGNOSIS — M19071 Primary osteoarthritis, right ankle and foot: Secondary | ICD-10-CM

## 2022-05-24 DIAGNOSIS — M19079 Primary osteoarthritis, unspecified ankle and foot: Secondary | ICD-10-CM

## 2022-05-31 ENCOUNTER — Ambulatory Visit: Admission: RE | Admit: 2022-05-31 | Payer: Medicaid Other | Source: Ambulatory Visit

## 2022-06-04 ENCOUNTER — Ambulatory Visit
Admission: RE | Admit: 2022-06-04 | Discharge: 2022-06-04 | Disposition: A | Discharge status: Home / Self Care | Payer: Medicaid Other | Source: Ambulatory Visit | Attending: Podiatry | Admitting: Podiatry

## 2022-06-04 DIAGNOSIS — M25571 Pain in right ankle and joints of right foot: Secondary | ICD-10-CM | POA: Diagnosis not present

## 2022-06-04 DIAGNOSIS — M19079 Primary osteoarthritis, unspecified ankle and foot: Secondary | ICD-10-CM | POA: Insufficient documentation

## 2022-06-04 DIAGNOSIS — G8929 Other chronic pain: Secondary | ICD-10-CM | POA: Insufficient documentation

## 2022-06-04 DIAGNOSIS — M19071 Primary osteoarthritis, right ankle and foot: Secondary | ICD-10-CM | POA: Insufficient documentation
